# Patient Record
Sex: Female | Born: 1985 | Race: Black or African American | Hispanic: No | Smoking: Never smoker
Health system: Southern US, Community
[De-identification: ages and names within clinical notes are randomized; demographics above are authoritative.]

## PROBLEM LIST (undated history)

## (undated) DIAGNOSIS — I10 Essential (primary) hypertension: Secondary | ICD-10-CM

## (undated) DIAGNOSIS — N809 Endometriosis, unspecified: Secondary | ICD-10-CM

## (undated) DIAGNOSIS — R7303 Prediabetes: Secondary | ICD-10-CM

## (undated) DIAGNOSIS — N83209 Unspecified ovarian cyst, unspecified side: Secondary | ICD-10-CM

## (undated) HISTORY — PX: WISDOM TOOTH EXTRACTION: SHX21

## (undated) HISTORY — DX: Unspecified ovarian cyst, unspecified side: N83.209

---

## 2013-01-11 DIAGNOSIS — E049 Nontoxic goiter, unspecified: Secondary | ICD-10-CM | POA: Insufficient documentation

## 2017-06-06 ENCOUNTER — Ambulatory Visit (INDEPENDENT_AMBULATORY_CARE_PROVIDER_SITE_OTHER): Payer: BLUE CROSS/BLUE SHIELD | Admitting: Podiatry

## 2017-06-06 ENCOUNTER — Other Ambulatory Visit: Payer: Self-pay | Admitting: Podiatry

## 2017-06-06 ENCOUNTER — Encounter: Payer: Self-pay | Admitting: Podiatry

## 2017-06-06 ENCOUNTER — Ambulatory Visit (INDEPENDENT_AMBULATORY_CARE_PROVIDER_SITE_OTHER): Payer: BLUE CROSS/BLUE SHIELD

## 2017-06-06 VITALS — BP 119/73 | HR 69

## 2017-06-06 DIAGNOSIS — S93409A Sprain of unspecified ligament of unspecified ankle, initial encounter: Secondary | ICD-10-CM

## 2017-06-06 DIAGNOSIS — M25579 Pain in unspecified ankle and joints of unspecified foot: Secondary | ICD-10-CM

## 2017-06-06 MED ORDER — MELOXICAM 15 MG PO TABS: 15.0000 mg | ORAL_TABLET | Freq: Every day | ORAL | 0 refills | Status: DC

## 2017-06-06 NOTE — Progress Notes (Signed)
  Subjective:  Patient ID: April Holland, female    DOB: 1986/05/16,  MRN: [id number] HPI Chief Complaint  Patient presents with  . Ankle Pain    B/L     31 y.o. female presents with the above complaint. Reports that she has had chronic issues with her ankles for 10 years. Reports she is very active with Zumba, HIIT, and other activity which impacts her feet and ankles. Reports pain along her ankles and her arches. States that 20 years ago someone fell on her Rt ankle during a basketball game. Reports no recent trauma.   No past medical history on file. No past surgical history on file.  Current Outpatient Prescriptions:  .  meloxicam (MOBIC) 15 MG tablet, Take 1 tablet (15 mg total) by mouth daily., Disp: 30 tablet, Rfl: 0  Allergies  Allergen Reactions  . Sulfur Rash   Review of Systems Objective:   Vitals:   06/06/17 1000  BP: 119/73  Pulse: 69   General AA&O x3. Normal mood and affect.  Vascular Dorsalis pedis and posterior tibial pulses 2/4 bilat. Brisk capillary refill to all digits. Pedal hair present.  Neurologic Epicritic sensation grossly intact.  Dermatologic No open lesions. Interspaces clear of maceration. Nails well groomed and normal in appearance.  Orthopedic: MMT 5/5 in dorsiflexion, plantarflexion, inversion, and eversion. Normal joint ROM without pain or crepitus. Pain on palpation of the deltoid ligaments bilat. Pain with talar tilt but no excessive laxity noted. No pain at ATFL or CFL bilat.  No pain along the distal posterior tibial tendon.  Heels rectus in stance. Able to perform double/single heel rise.   Radiographs: Taken and reviewed. No acute fractures or dislocations. Osteophytes noted at the medial malleolus bilat; valgus ankle tilt bilat. Assessment & Plan:  Patient was evaluated and treated and all questions answered.  Chronic Deltoid Pain, Insufficiency with Osteophytes -XR reviewed as above. -Pain appears confined to the deltoids, no  intra-articular pathology. -Will make appointment for orthotic fabrication. Plan for aggressive heel posting to relieve stress on the deltoid ligaments. Will f/u with patient same day that she receives the devices. -eRx Meloxicam for pain and inflammation to be taken on days where she anticipates being active.  Follow up after orthotic fabrication.

## 2017-06-09 ENCOUNTER — Ambulatory Visit (INDEPENDENT_AMBULATORY_CARE_PROVIDER_SITE_OTHER): Payer: BLUE CROSS/BLUE SHIELD | Admitting: Orthotics

## 2017-06-09 DIAGNOSIS — M25579 Pain in unspecified ankle and joints of unspecified foot: Secondary | ICD-10-CM

## 2017-06-09 NOTE — Progress Notes (Signed)
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  Patient has hx of ankle foot pain, today she presents with pain on medial aspect of ankle posterior and superior to medial malleous.  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  EVERFEET

## 2017-07-07 ENCOUNTER — Ambulatory Visit (INDEPENDENT_AMBULATORY_CARE_PROVIDER_SITE_OTHER): Payer: BLUE CROSS/BLUE SHIELD | Admitting: Orthotics

## 2017-07-07 DIAGNOSIS — M25579 Pain in unspecified ankle and joints of unspecified foot: Secondary | ICD-10-CM

## 2017-07-07 NOTE — Progress Notes (Signed)
Patient came in today to pick up custom made foot orthotics.  The goals were accomplished and the patient reported no dissatisfaction with said orthotics.  Patient was advised of breakin period and how to report any issues. 

## 2017-10-28 ENCOUNTER — Other Ambulatory Visit: Payer: Self-pay | Admitting: Nurse Practitioner

## 2017-10-28 DIAGNOSIS — N6310 Unspecified lump in the right breast, unspecified quadrant: Secondary | ICD-10-CM

## 2017-10-31 ENCOUNTER — Ambulatory Visit
Admission: RE | Admit: 2017-10-31 | Discharge: 2017-10-31 | Disposition: A | Discharge status: Home / Self Care | Payer: BLUE CROSS/BLUE SHIELD | Source: Ambulatory Visit | Attending: Nurse Practitioner | Admitting: Nurse Practitioner

## 2017-10-31 DIAGNOSIS — N6310 Unspecified lump in the right breast, unspecified quadrant: Secondary | ICD-10-CM

## 2020-07-26 ENCOUNTER — Other Ambulatory Visit: Payer: Self-pay | Admitting: Obstetrics and Gynecology

## 2020-07-26 DIAGNOSIS — N979 Female infertility, unspecified: Secondary | ICD-10-CM

## 2020-08-02 ENCOUNTER — Encounter (HOSPITAL_BASED_OUTPATIENT_CLINIC_OR_DEPARTMENT_OTHER): Payer: Self-pay | Admitting: Obstetrics and Gynecology

## 2020-08-02 ENCOUNTER — Other Ambulatory Visit: Payer: Self-pay

## 2020-08-04 ENCOUNTER — Encounter (HOSPITAL_BASED_OUTPATIENT_CLINIC_OR_DEPARTMENT_OTHER)
Admission: RE | Admit: 2020-08-04 | Discharge: 2020-08-04 | Disposition: A | Discharge status: Home / Self Care | Payer: No Typology Code available for payment source | Source: Ambulatory Visit | Attending: Obstetrics and Gynecology | Admitting: Obstetrics and Gynecology

## 2020-08-04 DIAGNOSIS — Z01810 Encounter for preprocedural cardiovascular examination: Secondary | ICD-10-CM | POA: Insufficient documentation

## 2020-08-04 DIAGNOSIS — I10 Essential (primary) hypertension: Secondary | ICD-10-CM | POA: Diagnosis not present

## 2020-08-08 ENCOUNTER — Other Ambulatory Visit (HOSPITAL_COMMUNITY)
Admission: RE | Admit: 2020-08-08 | Discharge: 2020-08-08 | Disposition: A | Discharge status: Home / Self Care | Payer: No Typology Code available for payment source | Source: Ambulatory Visit | Attending: Obstetrics and Gynecology | Admitting: Obstetrics and Gynecology

## 2020-08-08 ENCOUNTER — Other Ambulatory Visit: Payer: Self-pay | Admitting: Obstetrics and Gynecology

## 2020-08-08 DIAGNOSIS — Z20822 Contact with and (suspected) exposure to covid-19: Secondary | ICD-10-CM | POA: Diagnosis not present

## 2020-08-08 DIAGNOSIS — Z01812 Encounter for preprocedural laboratory examination: Secondary | ICD-10-CM | POA: Insufficient documentation

## 2020-08-08 LAB — SARS CORONAVIRUS 2 (TAT 6-24 HRS): SARS Coronavirus 2: NEGATIVE

## 2020-08-08 NOTE — H&P (Signed)
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  Does patient report new onset of COVID symptoms?  No.  Has patient or close contact tested positive for COVID-19?  No , not in the past 2 weeks.         General          34 yo presents for surgical consult for polyp removal.            Pt last seen by NP Ermalinda Memos on Sept 2, 2021 c/o longer and heavier menses than normal. LMP was May 30, 2020 and she started bleeding again on Sept 2nd. She is trying to conceive. HGB July 21 was 11. EMB performed Apr 21 was benign. SHG performed Sept 2, 2021 revealed polyp-like mass.            U/S performed Sept 2, 2021 revealed uterus measuring 10.7 x 6.0 x 6.4 cm. Endometrium thickened at 9.4 mm. SHG performed at that time which revealed hyperechoic mass on anterior wall w/ largest diameter measuring 2.7 cm.            Today, pt reports she is interested in future fertility. She is having regular periods and endorses intermenstrual bleeding and heavy periods. She uses ultra tampons on her heaviest day which she changes every 3 hrs.            She has been trying to conceive for 1.5 yrs. Her husband had an abnormal semen analysis which showed decreased morphology. He was recommended a vitamin regimen. Pt's AMH in Dec 2020 was normal.      Current Medication:      Taking   Multivitamin - Liquid as directed Orally.      amLODIPine Besylate 2.5 MG Tablet TAKE 1 TABLET BY MOUTH EVERY DAY.      Boric Acid . capsule 600 mg intravaginally once a week, Notes: prn.      Medication List reviewed and reconciled with the patient.      Medical History:   Recurring Bartholin Cyst (Starting age 13)      Heart murmur (unspecified), in childhood       Allergies/Intolerance:       sulfa: Allergy - as baby    Gyn History:   Sexual activity currently sexually active.   Periods : irregular-getting back normal.   LMP 06/30/2020.   Birth control none.   Last pap smear date 10/2018.   Last mammogram date 10/31/2017-benign.   Denies Abnormal pap smear.   STD TRICH, HSV II.   Menarche 9.   GYN procedures 06/26/17 - EMB.   Trying to get pregnant yes.        OB History:   Never been pregnant  per patient.        Surgical History:   No Surgical History documented.       Hospitalization:   No Hospitalization History.       Family History:   Father: alive, HTN    Mother: alive, thyroid hypo, chrohns, heart problems    Paternal Grand Father: deceased    Paternal Grand Mother: alive, stroke, HTN, high cholesterol, another stroke    Maternal Grand Father: deceased, DM, CHF, HTN    Maternal [place]  Mother: alive, stroke, breast cancer survivor, HTN    Brother 1: alive    1 brother(s) .      Social History:       General         Tobacco use cigarettes:  Never smoked, Tobacco history last updated  07/20/2020.           Alcohol: yes, social.           Caffeine: yes.           no Recreational drug use.           Marital Status: married, Married.           Children: none.           OCCUPATION: employed, Designer, fashion/clothing.      ROS:       CONSTITUTIONAL         Chills  No.  Fatigue  No.  Fever  No.  Night sweats  No.  Recent travel outside [place]  No.  Sweats  No.  Weight change  No.         OPHTHALMOLOGY         Blurring of vision  no.  Change in vision  no.  Double vision  no.         ENT         Dizziness  no.  Nose bleeds  no.  Sore throat  no.  Teeth pain  no.         ALLERGY         Hives  no.         CARDIOLOGY         Chest pain  no.  High blood pressure  YES.  Irregular heart beat  no.  Leg edema  no.  Palpitations  no.         RESPIRATORY         Shortness of breath  no.  Cough  no.  Wheezing  no.         UROLOGY         Pain with  urination  no.  Urinary urgency  no.  Urinary frequency  no.  Urinary incontinence  no.  Difficulty urinating  No.  Blood in urine  No.         GASTROENTEROLOGY         Abdominal pain  no.  Appetite change  no.  Bloating/belching  no.  Blood in stool or on toilet paper  no.  Change in bowel movements  no.  Constipation  no.  Diarrhea  no.  Difficulty swallowing  no.  Nausea  no.         FEMALE REPRODUCTIVE         Vulvar pain  no.  Vulvar rash  no.  Abnormal vaginal bleeding  YES.  Breast pain  no.  Nipple discharge  no.  Pain with intercourse  no.  Pelvic pain  no.  Unusual vaginal discharge  no.  Vaginal itching  no.         MUSCULOSKELETAL         Muscle aches  no.         NEUROLOGY         Headache  no.  Tingling/numbness  no.  Weakness  no.         PSYCHOLOGY         Depression  no.  Anxiety  no.  Nervousness  no.  Sleep disturbances  no.  Suicidal ideation  no .         ENDOCRINOLOGY         Excessive thirst  no.  Excessive urination  no.  Hair loss  no.  Heat or cold intolerance  no.         HEMATOLOGY/LYMPH         Abnormal bleeding  no.  Easy bruising  no.  Swollen glands  no.         DERMATOLOGY         New/changing skin lesion  no.  Rash  no.  Sores  no.       Objective:    Vitals:        Wt 232.8, Wt change -6.6 lb, Ht 67.5, BMI 35.92, Pulse sitting 76, BP sitting 132/88.     Past Results:    Examination:          General Examination         CONSTITUTIONAL: alert, oriented, NAD.          SKIN:  moist, warm.          EYES:  Conjunctiva clear.          LUNGS: good I:E efffort noted.... , , clear to auscultation bilaterally.          HEART:  regular rate and rhythm.          ABDOMEN: no guarding.          FEMALE GENITOURINARY:  normal external genitalia, labia - unremarkable, vagina - pink moist mucosa, no lesions or abnormal discharge, cervix - no discharge or lesions or CMT, adnexa - no masses or tenderness, uterus - nontender and normal size on palpation.           EXTREMITIES: normal range of motion.          NEUROLOGIC EXAM: alert and oriented x 3.          PSYCH:  affect normal.      Physical Examination:         Pt aware of scribe services today.    Assessment:     Assessment:    Endometrial polyp - N84.0 (Primary)      Abnormal uterine bleeding (AUB) - N93.9      Infertility, female - N97.9        Plan:    Treatment:      Endometrial polyp          Notes: U/S performed Sept 2, 2021 revealed uterus measuring 10.7 x 6.0 x 6.4 cm. Endometrium thickened at 9.4 mm. SHG performed at that time which revealed hyperechoic mass on anterior wall w/ largest diameter measuring 2.7 cm. Discussed that polyp may be causing irregular bleeding. Recommended management of hysteroscopy/D&C w/ polypectomy. Pt advised she will be able to return home the same day. Discussed w/ pt risks of hysteroscopy including but not limited to infection, bleeding, possible perforation of uterus, with the need for further surgery. Discussed post-surgery avoidance of driving for 24 hrs and avoidance of intercourse for 2 weeks after procedure. Will check insurance coverage for hysteroscopy/D&C and polypectomy w/ Myosure.          Referral To:               Reason:Please check insurance coverage for hysteroscopy/D & C and polypectomy w/ Myosure       Abnormal uterine bleeding (AUB)  Notes: This has been improving on its own. Recommended Lysteda only if periods become heavier. Pt advised she will have to take 2 pills TID for 5 days at onset of bleeding. Will schedule hysteroscopy/D&C and polypectomy w/ MyoSure.          Referral To:               Reason:Please check insurance coverage for hysteroscopy/D & C and polypectomy w/ Myosure       Infertility, female          Notes: This may be female factor as husband had decreased morphology on recent semen analysis. Pt advised to contact office at onset of next period to schedule HSG test on Days 7-10 to evaluate tubal  patency. [facility].

## 2020-08-08 NOTE — H&P (Deleted)
  The note originally documented on this encounter has been moved the the encounter in which it belongs.  

## 2020-08-09 ENCOUNTER — Ambulatory Visit (HOSPITAL_BASED_OUTPATIENT_CLINIC_OR_DEPARTMENT_OTHER): Payer: No Typology Code available for payment source | Admitting: Certified Registered Nurse Anesthetist

## 2020-08-09 ENCOUNTER — Encounter (HOSPITAL_BASED_OUTPATIENT_CLINIC_OR_DEPARTMENT_OTHER): Payer: Self-pay | Admitting: Obstetrics and Gynecology

## 2020-08-09 ENCOUNTER — Ambulatory Visit (HOSPITAL_BASED_OUTPATIENT_CLINIC_OR_DEPARTMENT_OTHER)
Admission: RE | Admit: 2020-08-09 | Discharge: 2020-08-09 | Disposition: A | Discharge status: Home / Self Care | Payer: No Typology Code available for payment source | Source: Ambulatory Visit | Attending: Obstetrics and Gynecology | Admitting: Obstetrics and Gynecology

## 2020-08-09 ENCOUNTER — Encounter (HOSPITAL_BASED_OUTPATIENT_CLINIC_OR_DEPARTMENT_OTHER)
Admission: RE | Disposition: A | Discharge status: Home / Self Care | Payer: Self-pay | Source: Ambulatory Visit | Attending: Obstetrics and Gynecology

## 2020-08-09 ENCOUNTER — Other Ambulatory Visit: Payer: Self-pay

## 2020-08-09 DIAGNOSIS — Z803 Family history of malignant neoplasm of breast: Secondary | ICD-10-CM | POA: Insufficient documentation

## 2020-08-09 DIAGNOSIS — N920 Excessive and frequent menstruation with regular cycle: Secondary | ICD-10-CM | POA: Insufficient documentation

## 2020-08-09 DIAGNOSIS — N840 Polyp of corpus uteri: Secondary | ICD-10-CM | POA: Diagnosis not present

## 2020-08-09 DIAGNOSIS — Z6835 Body mass index (BMI) 35.0-35.9, adult: Secondary | ICD-10-CM | POA: Insufficient documentation

## 2020-08-09 DIAGNOSIS — Z823 Family history of stroke: Secondary | ICD-10-CM | POA: Diagnosis not present

## 2020-08-09 DIAGNOSIS — N979 Female infertility, unspecified: Secondary | ICD-10-CM | POA: Diagnosis not present

## 2020-08-09 DIAGNOSIS — Z8349 Family history of other endocrine, nutritional and metabolic diseases: Secondary | ICD-10-CM | POA: Diagnosis not present

## 2020-08-09 DIAGNOSIS — I10 Essential (primary) hypertension: Secondary | ICD-10-CM | POA: Insufficient documentation

## 2020-08-09 DIAGNOSIS — Z79899 Other long term (current) drug therapy: Secondary | ICD-10-CM | POA: Diagnosis not present

## 2020-08-09 DIAGNOSIS — Z882 Allergy status to sulfonamides status: Secondary | ICD-10-CM | POA: Insufficient documentation

## 2020-08-09 DIAGNOSIS — Z833 Family history of diabetes mellitus: Secondary | ICD-10-CM | POA: Diagnosis not present

## 2020-08-09 DIAGNOSIS — Z8379 Family history of other diseases of the digestive system: Secondary | ICD-10-CM | POA: Insufficient documentation

## 2020-08-09 DIAGNOSIS — Z8249 Family history of ischemic heart disease and other diseases of the circulatory system: Secondary | ICD-10-CM | POA: Diagnosis not present

## 2020-08-09 DIAGNOSIS — N939 Abnormal uterine and vaginal bleeding, unspecified: Secondary | ICD-10-CM | POA: Diagnosis present

## 2020-08-09 DIAGNOSIS — E669 Obesity, unspecified: Secondary | ICD-10-CM | POA: Diagnosis not present

## 2020-08-09 HISTORY — PX: DILATATION & CURETTAGE/HYSTEROSCOPY WITH MYOSURE: SHX6511

## 2020-08-09 HISTORY — DX: Essential (primary) hypertension: I10

## 2020-08-09 LAB — POCT PREGNANCY, URINE: Preg Test, Ur: NEGATIVE

## 2020-08-09 SURGERY — DILATATION & CURETTAGE/HYSTEROSCOPY WITH MYOSURE
Anesthesia: General | Site: Vagina

## 2020-08-09 MED ORDER — DEXAMETHASONE SODIUM PHOSPHATE 4 MG/ML IJ SOLN
INTRAMUSCULAR | Status: DC | PRN
  Administered 2020-08-09: 5 mg via INTRAVENOUS

## 2020-08-09 MED ORDER — LACTATED RINGERS IV SOLN: INTRAVENOUS | Status: DC

## 2020-08-09 MED ORDER — PROPOFOL 10 MG/ML IV BOLUS
INTRAVENOUS | Status: DC | PRN
  Administered 2020-08-09: 180 mg via INTRAVENOUS

## 2020-08-09 MED ORDER — FENTANYL CITRATE (PF) 100 MCG/2ML IJ SOLN: 25.0000 ug | INTRAMUSCULAR | Status: DC | PRN

## 2020-08-09 MED ORDER — POVIDONE-IODINE 10 % EX SWAB
2.0000 "application " | Freq: Once | CUTANEOUS | Status: AC
  Administered 2020-08-09: 2 via TOPICAL

## 2020-08-09 MED ORDER — SILVER NITRATE-POT NITRATE 75-25 % EX MISC
CUTANEOUS | Status: DC | PRN
  Administered 2020-08-09: 2

## 2020-08-09 MED ORDER — MIDAZOLAM HCL 2 MG/2ML IJ SOLN
INTRAMUSCULAR | Status: AC
  Filled 2020-08-09: qty 2

## 2020-08-09 MED ORDER — CELECOXIB 200 MG PO CAPS
ORAL_CAPSULE | ORAL | Status: AC
  Filled 2020-08-09: qty 2

## 2020-08-09 MED ORDER — DEXAMETHASONE SODIUM PHOSPHATE 10 MG/ML IJ SOLN
INTRAMUSCULAR | Status: AC
  Filled 2020-08-09: qty 1

## 2020-08-09 MED ORDER — LIDOCAINE HCL (CARDIAC) PF 100 MG/5ML IV SOSY
PREFILLED_SYRINGE | INTRAVENOUS | Status: DC | PRN
  Administered 2020-08-09: 100 mg via INTRAVENOUS

## 2020-08-09 MED ORDER — ACETAMINOPHEN 500 MG PO TABS
ORAL_TABLET | ORAL | Status: AC
  Filled 2020-08-09: qty 2

## 2020-08-09 MED ORDER — BUPIVACAINE HCL (PF) 0.25 % IJ SOLN
INTRAMUSCULAR | Status: AC
  Filled 2020-08-09: qty 30

## 2020-08-09 MED ORDER — SODIUM CHLORIDE 0.9 % IR SOLN
Status: DC | PRN
  Administered 2020-08-09: 3000 mL

## 2020-08-09 MED ORDER — BUPIVACAINE HCL (PF) 0.25 % IJ SOLN
INTRAMUSCULAR | Status: DC | PRN
  Administered 2020-08-09: 20 mL

## 2020-08-09 MED ORDER — PROPOFOL 10 MG/ML IV BOLUS
INTRAVENOUS | Status: AC
  Filled 2020-08-09: qty 20

## 2020-08-09 MED ORDER — SCOPOLAMINE 1 MG/3DAYS TD PT72
MEDICATED_PATCH | TRANSDERMAL | Status: AC
  Filled 2020-08-09: qty 1

## 2020-08-09 MED ORDER — ONDANSETRON HCL 4 MG/2ML IJ SOLN
INTRAMUSCULAR | Status: DC | PRN
  Administered 2020-08-09: 4 mg via INTRAVENOUS

## 2020-08-09 MED ORDER — IBUPROFEN 800 MG PO TABS: 800.0000 mg | ORAL_TABLET | Freq: Three times a day (TID) | ORAL | 0 refills | Status: DC | PRN

## 2020-08-09 MED ORDER — PROMETHAZINE HCL 25 MG/ML IJ SOLN: 6.2500 mg | INTRAMUSCULAR | Status: DC | PRN

## 2020-08-09 MED ORDER — MIDAZOLAM HCL 5 MG/5ML IJ SOLN
INTRAMUSCULAR | Status: DC | PRN
  Administered 2020-08-09: 2 mg via INTRAVENOUS

## 2020-08-09 MED ORDER — ACETAMINOPHEN 500 MG PO TABS
1000.0000 mg | ORAL_TABLET | ORAL | Status: AC
  Administered 2020-08-09: 1000 mg via ORAL

## 2020-08-09 MED ORDER — FENTANYL CITRATE (PF) 100 MCG/2ML IJ SOLN
INTRAMUSCULAR | Status: DC | PRN
Start: 2020-08-09 — End: 2020-08-09
  Administered 2020-08-09 (×2): 25 ug via INTRAVENOUS
  Administered 2020-08-09: 50 ug via INTRAVENOUS

## 2020-08-09 MED ORDER — LIDOCAINE 2% (20 MG/ML) 5 ML SYRINGE
INTRAMUSCULAR | Status: AC
  Filled 2020-08-09: qty 5

## 2020-08-09 MED ORDER — SCOPOLAMINE 1 MG/3DAYS TD PT72
1.0000 | MEDICATED_PATCH | Freq: Once | TRANSDERMAL | Status: DC
  Administered 2020-08-09: 1.5 mg via TRANSDERMAL

## 2020-08-09 MED ORDER — ONDANSETRON HCL 4 MG/2ML IJ SOLN
INTRAMUSCULAR | Status: AC
  Filled 2020-08-09: qty 2

## 2020-08-09 MED ORDER — FENTANYL CITRATE (PF) 100 MCG/2ML IJ SOLN
INTRAMUSCULAR | Status: AC
  Filled 2020-08-09: qty 2

## 2020-08-09 MED ORDER — CELECOXIB 200 MG PO CAPS
400.0000 mg | ORAL_CAPSULE | ORAL | Status: AC
  Administered 2020-08-09: 400 mg via ORAL

## 2020-08-09 SURGICAL SUPPLY — 17 items
CATH ROBINSON RED A/P 16FR (CATHETERS) ×3 IMPLANT
DEVICE MYOSURE LITE (MISCELLANEOUS) ×3 IMPLANT
DEVICE MYOSURE REACH (MISCELLANEOUS) IMPLANT
GAUZE 4X4 16PLY RFD (DISPOSABLE) ×3 IMPLANT
GLOVE BIOGEL M 6.5 STRL (GLOVE) ×3 IMPLANT
GLOVE BIOGEL PI IND STRL 6.5 (GLOVE) ×1 IMPLANT
GLOVE BIOGEL PI IND STRL 7.0 (GLOVE) ×1 IMPLANT
GLOVE BIOGEL PI INDICATOR 6.5 (GLOVE) ×2
GLOVE BIOGEL PI INDICATOR 7.0 (GLOVE) ×2
GOWN STRL REUS W/TWL LRG LVL3 (GOWN DISPOSABLE) ×6 IMPLANT
KIT PROCEDURE FLUENT (KITS) ×3 IMPLANT
PACK VAGINAL MINOR WOMEN LF (CUSTOM PROCEDURE TRAY) ×3 IMPLANT
PAD OB MATERNITY 4.3X12.25 (PERSONAL CARE ITEMS) ×3 IMPLANT
PAD PREP 24X48 CUFFED NSTRL (MISCELLANEOUS) ×3 IMPLANT
SEAL ROD LENS SCOPE MYOSURE (ABLATOR) ×3 IMPLANT
SLEEVE SCD COMPRESS KNEE MED (MISCELLANEOUS) ×3 IMPLANT
TOWEL GREEN STERILE FF (TOWEL DISPOSABLE) ×3 IMPLANT

## 2020-08-09 NOTE — Discharge Instructions (Signed)

## 2020-08-09 NOTE — Transfer of Care (Signed)
Immediate Anesthesia Transfer of Care Note  Patient: Cassara Nida  Procedure(s) Performed: DILATATION & CURETTAGE/HYSTEROSCOPY WITH MYOSURE (N/A Vagina )  Patient Location: PACU  Anesthesia Type:General  Level of Consciousness: awake, alert  and oriented  Airway & Oxygen Therapy: Patient Spontanous Breathing and Patient connected to nasal cannula oxygen  Post-op Assessment: Report given to RN and Post -op Vital signs reviewed and stable  Post vital signs: Reviewed and stable  Last Vitals:  Vitals Value Taken Time  BP    Temp    Pulse 82 08/09/20 1135  Resp 17 08/09/20 1135  SpO2 100 % 08/09/20 1135  Vitals shown include unvalidated device data.  Last Pain:  Vitals:   08/09/20 1003  TempSrc: Oral  PainSc: 0-No pain      Patients Stated Pain Goal: 4 (08/09/20 1003)  Complications: No complications documented.

## 2020-08-09 NOTE — Op Note (Signed)
08/09/2020  11:26 AM  PATIENT:  April Holland  34 y.o. female  PRE-OPERATIVE DIAGNOSIS:  N84.0 endometrial polyp N93.9 abnormal uterine bleeding  POST-OPERATIVE DIAGNOSIS:  endometrial polyp abnormal uterine bleeding  PROCEDURE:  Procedure(s): DILATATION & CURETTAGE/HYSTEROSCOPY WITH MYOSURE (N/A)  SURGEON:  Surgeon(s) and Role:    Gerald Leitz, MD - Primary  PHYSICIAN ASSISTANT:   ASSISTANTS: none   ANESTHESIA:   general  EBL:  20 mL   BLOOD ADMINISTERED:none  DRAINS: none   LOCAL MEDICATIONS USED:  MARCAINE     SPECIMEN:  Source of Specimen:  endometrial polyp  DISPOSITION OF SPECIMEN:  PATHOLOGY  COUNTS:  YES  TOURNIQUET:  * No tourniquets in log *  DICTATION: .Dragon Dictation  PLAN OF CARE: Discharge to home after PACU  PATIENT DISPOSITION:  PACU - hemodynamically stable.   Delay start of Pharmacological VTE agent (>24hrs) due to surgical blood loss or risk of bleeding: not applicable  Findings:  Normal appearing external genitalia, normal appearing cervix. 2 cm endometrial polyp.   Saline deficit less than 100 mL   Procedure: Patient was taken to the operating room where she was placed under general anesthesia. She was placed in the dorsal lithotomy position. She was prepped and draped in the usual sterile fashion. A speculum was placed into the vaginal vault. The anterior lip of the cervix was grasped with a single-tooth tenaculum. Quarter percent Marcaine was injected at the 4 and 8:00 positions of the cervix. The cervix was then sounded to 9 cm. The cervix was dilated to approximately 6 mm. Mysosure operative  hysteroscope was inserted. The findings noted above. Myosure lite   blade was introduced through the hysteroscope. The endometrial mass was removed in less than  5 minute.  There was no evidence of perforation. Hysteroscope was then removed.  The single-tooth tenaculum was removed from the anterior lip of the cervix. Patient was noted to have  bleeding from the tenaculum site. Silver nitrate was applied and excellent hemostasis was noted. The speculum was removed from the patient's vagina. She was awakened from anesthesia taken care  To the recovery  room awake and in stable condition. Sponge lap and needle counts were correct x2.

## 2020-08-09 NOTE — Anesthesia Preprocedure Evaluation (Signed)
Anesthesia Evaluation  Patient identified by MRN, date of birth, ID band Patient awake    Reviewed: Allergy & Precautions, NPO status , Patient's Chart, lab work & pertinent test results  Airway Mallampati: II  TM Distance: >3 FB Neck ROM: Full    Dental  (+) Teeth Intact   Pulmonary neg pulmonary ROS,    Pulmonary exam normal breath sounds clear to auscultation       Cardiovascular hypertension, Pt. on medications Normal cardiovascular exam Rhythm:Regular Rate:Normal     Neuro/Psych negative neurological ROS  negative psych ROS   GI/Hepatic negative GI ROS, Neg liver ROS,   Endo/Other  Obesity   Renal/GU negative Renal ROS     Musculoskeletal negative musculoskeletal ROS (+)   Abdominal   Peds  Hematology negative hematology ROS (+)   Anesthesia Other Findings Day of surgery medications reviewed with the patient.  Reproductive/Obstetrics AUB                             Anesthesia Physical Anesthesia Plan  ASA: II  Anesthesia Plan: General   Post-op Pain Management:    Induction: Intravenous  PONV Risk Score and Plan: 4 or greater and Midazolam, Scopolamine patch - Pre-op, Dexamethasone and Ondansetron  Airway Management Planned: LMA  Additional Equipment:   Intra-op Plan:   Post-operative Plan: Extubation in OR  Informed Consent: I have reviewed the patients History and Physical, chart, labs and discussed the procedure including the risks, benefits and alternatives for the proposed anesthesia with the patient or authorized representative who has indicated his/her understanding and acceptance.       Plan Discussed with: CRNA  Anesthesia Plan Comments:         Anesthesia Quick Evaluation

## 2020-08-09 NOTE — H&P (Signed)
Date of Initial H&P: 08/08/2020  History reviewed, patient examined, no change in status, stable for surgery.

## 2020-08-09 NOTE — Anesthesia Postprocedure Evaluation (Signed)
Anesthesia Post Note  Patient: April Holland  Procedure(s) Performed: DILATATION & CURETTAGE/HYSTEROSCOPY WITH MYOSURE (N/A Vagina )     Patient location during evaluation: PACU Anesthesia Type: General Level of consciousness: awake and alert, awake and oriented Pain management: pain level controlled Vital Signs Assessment: post-procedure vital signs reviewed and stable Respiratory status: spontaneous breathing, nonlabored ventilation and respiratory function stable Cardiovascular status: blood pressure returned to baseline and stable Postop Assessment: no apparent nausea or vomiting Anesthetic complications: no   No complications documented.  Last Vitals:  Vitals:   08/09/20 1153 08/09/20 1210  BP: 130/86 (!) 157/84  Pulse: 85 80  Resp: 17 17  Temp:  36.6 C  SpO2: 100% 100%    Last Pain:  Vitals:   08/09/20 1210  TempSrc:   PainSc: 0-No pain                 Cecile Hearing

## 2020-08-10 ENCOUNTER — Encounter (HOSPITAL_BASED_OUTPATIENT_CLINIC_OR_DEPARTMENT_OTHER): Payer: Self-pay | Admitting: Obstetrics and Gynecology

## 2020-08-10 LAB — SURGICAL PATHOLOGY

## 2020-10-21 HISTORY — PX: HYSTEROSCOPY: SHX211

## 2021-02-13 ENCOUNTER — Other Ambulatory Visit (HOSPITAL_COMMUNITY): Payer: Self-pay | Admitting: Internal Medicine

## 2021-02-13 DIAGNOSIS — R002 Palpitations: Secondary | ICD-10-CM

## 2021-02-16 ENCOUNTER — Telehealth (HOSPITAL_COMMUNITY): Payer: Self-pay | Admitting: *Deleted

## 2021-02-16 NOTE — Telephone Encounter (Signed)
Close encounter 

## 2021-02-20 ENCOUNTER — Ambulatory Visit (HOSPITAL_COMMUNITY)
Admission: RE | Admit: 2021-02-20 | Payer: No Typology Code available for payment source | Source: Ambulatory Visit | Attending: Internal Medicine | Admitting: Internal Medicine

## 2021-02-22 ENCOUNTER — Telehealth (HOSPITAL_COMMUNITY): Payer: Self-pay | Admitting: *Deleted

## 2021-02-22 NOTE — Telephone Encounter (Signed)
Close encounter 

## 2021-02-23 ENCOUNTER — Ambulatory Visit (HOSPITAL_COMMUNITY)
Admission: RE | Admit: 2021-02-23 | Discharge: 2021-02-23 | Disposition: A | Discharge status: Home / Self Care | Payer: No Typology Code available for payment source | Source: Ambulatory Visit | Attending: Cardiology | Admitting: Cardiology

## 2021-02-23 ENCOUNTER — Other Ambulatory Visit: Payer: Self-pay

## 2021-02-23 DIAGNOSIS — R002 Palpitations: Secondary | ICD-10-CM | POA: Diagnosis not present

## 2021-02-23 LAB — EXERCISE TOLERANCE TEST
Estimated workload: 10.1 METS
Exercise duration (min): 8 min
Exercise duration (sec): 17 s
MPHR: 183 {beats}/min
Peak HR: 181 {beats}/min
Percent HR: 98 %
Rest HR: 81 {beats}/min

## 2021-04-20 ENCOUNTER — Other Ambulatory Visit: Payer: Self-pay | Admitting: Gastroenterology

## 2021-04-20 DIAGNOSIS — R7989 Other specified abnormal findings of blood chemistry: Secondary | ICD-10-CM

## 2021-04-30 ENCOUNTER — Encounter: Payer: Self-pay | Admitting: Cardiovascular Disease

## 2021-05-21 ENCOUNTER — Other Ambulatory Visit: Payer: No Typology Code available for payment source

## 2021-06-05 ENCOUNTER — Ambulatory Visit (HOSPITAL_BASED_OUTPATIENT_CLINIC_OR_DEPARTMENT_OTHER): Payer: No Typology Code available for payment source | Admitting: Cardiovascular Disease

## 2021-07-09 ENCOUNTER — Ambulatory Visit (HOSPITAL_BASED_OUTPATIENT_CLINIC_OR_DEPARTMENT_OTHER): Payer: No Typology Code available for payment source | Admitting: Cardiovascular Disease

## 2021-08-20 ENCOUNTER — Ambulatory Visit (HOSPITAL_BASED_OUTPATIENT_CLINIC_OR_DEPARTMENT_OTHER): Payer: No Typology Code available for payment source | Admitting: Cardiovascular Disease

## 2021-09-17 ENCOUNTER — Encounter (HOSPITAL_BASED_OUTPATIENT_CLINIC_OR_DEPARTMENT_OTHER): Payer: Self-pay | Admitting: Cardiovascular Disease

## 2021-09-17 ENCOUNTER — Ambulatory Visit (HOSPITAL_BASED_OUTPATIENT_CLINIC_OR_DEPARTMENT_OTHER): Payer: No Typology Code available for payment source | Admitting: Cardiovascular Disease

## 2021-09-17 ENCOUNTER — Other Ambulatory Visit: Payer: Self-pay

## 2021-09-17 DIAGNOSIS — I10 Essential (primary) hypertension: Secondary | ICD-10-CM | POA: Diagnosis not present

## 2021-09-17 HISTORY — DX: Essential (primary) hypertension: I10

## 2021-09-17 NOTE — Progress Notes (Signed)
Advanced Hypertension Clinic Initial Assessment:    Date:  09/17/2021   ID:  April Holland, DOB 18-Apr-1986, MRN [id number]  PCP:  Gyne[facility]  Cardiologist:  None  Nephrologist:  Referring MD: Maxie Better, MD   CC: Hypertension  History of Present Illness:    April Holland is a 35 y.o. female with a hx of hypertension here to establish care in the Advanced Hypertension Clinic.  She saw Dr. Cherly Hensen 01/2021 and was contemplating IVF at that time.  At that visit her blood pressure was 144/92.  Her labetalol was increased and she was referred to advanced hypertension clinic for preconception planning.  She had an exercise stress test 02/2021 that was negative.  She achieved 10.1 METS on a Bruce protocol.  She was first diagnosed with HTN a couple years ago.  She started treatment about a year ago. Lately her BP has been as high as 170/110.  It averages around 150/90.  She was previously on labetalol 100 mg twice a day without improvement.  Prior to that she was on amlodipine 2.5 mg and this did not control her blood pressure either.  She hasn't been exercising much.  She works at [employer].  She does housework but no other exercise.  She wants to start a daily walk.  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  She has no chest pain or shortness of breath.  She has no LE edema, orthopnea or PND.  She has been fasting by eliminating gluten, dairy and sugar and was on fluconazonle.  She lost 10-15 lb.  She is working on transitioning to a high protein, moderate carbs.  Overall she is feeling well.  She sees a functional medicine doctor and notes that one of her thyroid antibodies was abnormal but TSH, T3, and T4 were normal.    Previous antihypertensives: Amlodipine Labetalol  Past Medical History:  Diagnosis Date   Essential hypertension 09/17/2021   Hypertension     Past Surgical History:  Procedure Laterality Date   DILATATION &  CURETTAGE/HYSTEROSCOPY WITH MYOSURE N/A 08/09/2020   Procedure: DILATATION & CURETTAGE/HYSTEROSCOPY WITH MYOSURE;  Surgeon: Gerald Leitz, MD;  Location: [facility];  Service: Gynecology;  Laterality: N/A;   WISDOM TOOTH EXTRACTION      Current Medications: Current Meds  Medication Sig   metFORMIN (GLUCOPHAGE) 500 MG tablet Take 500 mg by mouth 2 (two) times daily.   tranexamic acid (LYSTEDA) 650 MG TABS tablet Take 1,300 mg by mouth 3 (three) times daily.     Allergies:   Elemental sulfur   Social History   Socioeconomic History   Marital status: Married    Spouse name: Not on file   Number of children: Not on file   Years of education: Not on file   Highest education level: Not on file  Occupational History   Not on file  Tobacco Use   Smoking status: Never   Smokeless tobacco: Never  Substance and Sexual Activity   Alcohol use: Yes    Comment: social   Drug use: No   Sexual activity: Not on file  Other Topics Concern   Not on file  Social History Narrative   Not on file   Social Determinants of Health   Financial Resource Strain: Low Risk    Difficulty of Paying Living Expenses: Not very hard  Food Insecurity: No Food Insecurity   Worried About Running Out of Food in the Last Year:  Never true   Ran Out of Food in the Last Year: Never true  Transportation Needs: No Transportation Needs   Lack of Transportation (Medical): No   Lack of Transportation (Non-Medical): No  Physical Activity: Inactive   Days of Exercise per Week: 0 days   Minutes of Exercise per Session: 0 min  Stress: Not on file  Social Connections: Not on file     Family History: The patient's family history includes Breast cancer in her maternal grandfather; Diabetes in her maternal grandfather; Heart failure in her maternal grandfather; Stroke in her maternal grandmother and paternal grandmother; Thyroid disease in her mother.  ROS:   Please see the history of present illness.     All other systems reviewed and are negative.  EKGs/Labs/Other Studies Reviewed:    EKG:  EKG is ordered today.  The ekg ordered today demonstrates sinus rhythm.  94 bpm.  Recent Labs: No results found for requested labs within last 8760 hours.   Recent Lipid Panel No results found for: CHOL, TRIG, HDL, CHOLHDL, VLDL, LDLCALC, LDLDIRECT  Physical Exam:   VS:  BP 122/78   Pulse 94   Ht 5\' 8"  (1.727 m)   Wt 230 lb (104.3 kg)   BMI 34.97 kg/m  , BMI Body mass index is 34.97 kg/m. GENERAL:  Well appearing HEENT: Pupils equal round and reactive, fundi not visualized, oral mucosa unremarkable NECK:  No jugular venous distention, waveform within normal limits, carotid upstroke brisk and symmetric, no bruits, no thyromegaly LUNGS:  Clear to auscultation bilaterally HEART:  RRR.  PMI not displaced or sustained,S1 and S2 within normal limits, no S3, no S4, no clicks, no rubs, no murmurs ABD:  Flat, positive bowel sounds normal in frequency in pitch, no bruits, no rebound, no guarding, no midline pulsatile mass, no hepatomegaly, no splenomegaly EXT:  2 plus pulses throughout, no edema, no cyanosis no clubbing SKIN:  No rashes no nodules NEURO:  Cranial nerves II through XII grossly intact, motor grossly intact throughout PSYCH:  Cognitively intact, oriented to person place and time   ASSESSMENT/PLAN:    Essential hypertension Ms. Reigel has hypertension.  However her blood pressure seems to be doing much better now that she has been fasting and losing weight.  She is continuing to work on exercise and it was recommended that she increase to 650 minutes weekly.  She is going to keep tracking her blood pressure and be mindful as she reintroduces foods into her diet.  She will continue to limit sodium and try to aim for less than 2500 mg daily.  She is going to check her blood pressures and bring to follow-up in 2 to 3 months.  She is considering IVF as well as try to conceive naturally.  We  will keep this in mind should she need medication in the future.   Screening for Secondary Hypertension:  Causes 09/17/2021  Drugs/Herbals Screened     - Comments limiting salt.  1 coffee daily. iron supplement, occasional EtOH  Renovascular HTN N/A  Sleep Apnea Screened     - Comments no symptoms  Thyroid Disease Screened  Hyperaldosteronism N/A  Pheochromocytoma N/A  Cushing's Syndrome N/A  Hyperparathyroidism N/A  Coarctation of the Aorta Screened  Compliance Screened     - Comments ran out of BP medication    Relevant Labs/Studies:        Disposition:    FU with MD/PharmD in 2-3 months.    Medication Adjustments/Labs and Tests Ordered: Current  medicines are reviewed at length with the patient today.  Concerns regarding medicines are outlined above.  Orders Placed This Encounter  Procedures   EKG 12-Lead    No orders of the defined types were placed in this encounter.    Signed, Chilton Si, MD  09/17/2021 6:05 PM    [facility]

## 2021-09-17 NOTE — Assessment & Plan Note (Signed)
April Holland has hypertension.  However her blood pressure seems to be doing much better now that she has been fasting and losing weight.  She is continuing to work on exercise and it was recommended that she increase to 650 minutes weekly.  She is going to keep tracking her blood pressure and be mindful as she reintroduces foods into her diet.  She will continue to limit sodium and try to aim for less than 2500 mg daily.  She is going to check her blood pressures and bring to follow-up in 2 to 3 months.  She is considering IVF as well as try to conceive naturally.  We will keep this in mind should she need medication in the future.

## 2021-09-17 NOTE — Patient Instructions (Signed)
Medication Instructions:  Your physician recommends that you continue on your current medications as directed. Please refer to the Current Medication list given to you today.    Labwork: NONE    Testing/Procedures: NONE   Follow-Up: 12/05/2021 AT 9:00 AM    Special Instructions:  MONITOR YOUR BLOOD PRESSURE DAILY,  LOG IN THE BOOK PROVIDED. BRING THE BOOK AND YOUR BLOOD PRESSURE MACHINE TO YOUR FOLLOW UP   DASH Eating Plan DASH stands for "Dietary Approaches to Stop Hypertension." The DASH eating plan is a healthy eating plan that has been shown to reduce high blood pressure (hypertension). It may also reduce your risk for type 2 diabetes, heart disease, and stroke. The DASH eating plan may also help with weight loss. What are tips for following this plan?  General guidelines Avoid eating more than 2,300 mg (milligrams) of salt (sodium) a day. If you have hypertension, you may need to reduce your sodium intake to 1,500 mg a day. Limit alcohol intake to no more than 1 drink a day for nonpregnant women and 2 drinks a day for men. One drink equals 12 oz of beer, 5 oz of wine, or 1 oz of hard liquor. Work with your health care provider to maintain a healthy body weight or to lose weight. Ask what an ideal weight is for you. Get at least 30 minutes of exercise that causes your heart to beat faster (aerobic exercise) most days of the week. Activities may include walking, swimming, or biking. Work with your health care provider or diet and nutrition specialist (dietitian) to adjust your eating plan to your individual calorie needs. Reading food labels  Check food labels for the amount of sodium per serving. Choose foods with less than 5 percent of the Daily Value of sodium. Generally, foods with less than 300 mg of sodium per serving fit into this eating plan. To find whole grains, look for the word "whole" as the first word in the ingredient list. Shopping Buy products labeled as "low-sodium"  or "no salt added." Buy fresh foods. Avoid canned foods and premade or frozen meals. Cooking Avoid adding salt when cooking. Use salt-free seasonings or herbs instead of table salt or sea salt. Check with your health care provider or pharmacist before using salt substitutes. Do not fry foods. Cook foods using healthy methods such as baking, boiling, grilling, and broiling instead. Cook with heart-healthy oils, such as olive, canola, soybean, or sunflower oil. Meal planning Eat a balanced diet that includes: 5 or more servings of fruits and vegetables each day. At each meal, try to fill half of your plate with fruits and vegetables. Up to 6-8 servings of whole grains each day. Less than 6 oz of lean meat, poultry, or fish each day. A 3-oz serving of meat is about the same size as a deck of cards. One egg equals 1 oz. 2 servings of low-fat dairy each day. A serving of nuts, seeds, or beans 5 times each week. Heart-healthy fats. Healthy fats called Omega-3 fatty acids are found in foods such as flaxseeds and coldwater fish, like sardines, salmon, and mackerel. Limit how much you eat of the following: Canned or prepackaged foods. Food that is high in trans fat, such as fried foods. Food that is high in saturated fat, such as fatty meat. Sweets, desserts, sugary drinks, and other foods with added sugar. Full-fat dairy products. Do not salt foods before eating. Try to eat at least 2 vegetarian meals each week. Eat more home-cooked  food and less restaurant, buffet, and fast food. When eating at a restaurant, ask that your food be prepared with less salt or no salt, if possible. What foods are recommended? The items listed may not be a complete list. Talk with your dietitian about what dietary choices are best for you. Grains Whole-grain or whole-wheat bread. Whole-grain or whole-wheat pasta. Brown rice. Orpah Cobb. Bulgur. Whole-grain and low-sodium cereals. Pita bread. Low-fat, low-sodium  crackers. Whole-wheat flour tortillas. Vegetables Fresh or frozen vegetables (raw, steamed, roasted, or grilled). Low-sodium or reduced-sodium tomato and vegetable juice. Low-sodium or reduced-sodium tomato sauce and tomato paste. Low-sodium or reduced-sodium canned vegetables. Fruits All fresh, dried, or frozen fruit. Canned fruit in natural juice (without added sugar). Meat and other protein foods Skinless chicken or [place]. Ground chicken or [place]. Pork with fat trimmed off. Fish and seafood. Egg whites. Dried beans, peas, or lentils. Unsalted nuts, nut butters, and seeds. Unsalted canned beans. Lean cuts of beef with fat trimmed off. Low-sodium, lean deli meat. Dairy Low-fat (1%) or fat-free (skim) milk. Fat-free, low-fat, or reduced-fat cheeses. Nonfat, low-sodium ricotta or cottage cheese. Low-fat or nonfat yogurt. Low-fat, low-sodium cheese. Fats and oils Soft margarine without trans fats. Vegetable oil. Low-fat, reduced-fat, or light mayonnaise and salad dressings (reduced-sodium). Canola, safflower, olive, soybean, and sunflower oils. Avocado. Seasoning and other foods Herbs. Spices. Seasoning mixes without salt. Unsalted popcorn and pretzels. Fat-free sweets. What foods are not recommended? The items listed may not be a complete list. Talk with your dietitian about what dietary choices are best for you. Grains Baked goods made with fat, such as croissants, muffins, or some breads. Dry pasta or rice meal packs. Vegetables Creamed or fried vegetables. Vegetables in a cheese sauce. Regular canned vegetables (not low-sodium or reduced-sodium). Regular canned tomato sauce and paste (not low-sodium or reduced-sodium). Regular tomato and vegetable juice (not low-sodium or reduced-sodium). Rosita Fire. Olives. Fruits Canned fruit in a light or heavy syrup. Fried fruit. Fruit in cream or butter sauce. Meat and other protein foods Fatty cuts of meat. Ribs. Fried meat. Tomasa Blase. Sausage. Bologna and  other processed lunch meats. Salami. Fatback. Hotdogs. Bratwurst. Salted nuts and seeds. Canned beans with added salt. Canned or smoked fish. Whole eggs or egg yolks. Chicken or [place] with skin. Dairy Whole or 2% milk, cream, and half-and-half. Whole or full-fat cream cheese. Whole-fat or sweetened yogurt. Full-fat cheese. Nondairy creamers. Whipped toppings. Processed cheese and cheese spreads. Fats and oils Butter. Stick margarine. Lard. Shortening. Ghee. Bacon fat. Tropical oils, such as coconut, palm kernel, or palm oil. Seasoning and other foods Salted popcorn and pretzels. Onion salt, garlic salt, seasoned salt, table salt, and sea salt. Worcestershire sauce. Tartar sauce. Barbecue sauce. Teriyaki sauce. Soy sauce, including reduced-sodium. Steak sauce. Canned and packaged gravies. Fish sauce. Oyster sauce. Cocktail sauce. Horseradish that you find on the shelf. Ketchup. Mustard. Meat flavorings and tenderizers. Bouillon cubes. Hot sauce and Tabasco sauce. Premade or packaged marinades. Premade or packaged taco seasonings. Relishes. Regular salad dressings. Where to find more information: National Heart, Lung, and Blood Institute: PopSteam.is American Heart Association: www.heart.org Summary The DASH eating plan is a healthy eating plan that has been shown to reduce high blood pressure (hypertension). It may also reduce your risk for type 2 diabetes, heart disease, and stroke. With the DASH eating plan, you should limit salt (sodium) intake to 2,300 mg a day. If you have hypertension, you may need to reduce your sodium intake to 1,500 mg a day. When on the  DASH eating plan, aim to eat more fresh fruits and vegetables, whole grains, lean proteins, low-fat dairy, and heart-healthy fats. Work with your health care provider or diet and nutrition specialist (dietitian) to adjust your eating plan to your individual calorie needs. This information is not intended to replace advice given to you  by your health care provider. Make sure you discuss any questions you have with your health care provider. Document Released: 09/26/2011 Document Revised: 09/19/2017 Document Reviewed: 09/30/2016 Elsevier Patient Education  2020 [employer].

## 2021-10-08 ENCOUNTER — Ambulatory Visit (HOSPITAL_BASED_OUTPATIENT_CLINIC_OR_DEPARTMENT_OTHER): Payer: No Typology Code available for payment source | Admitting: Cardiovascular Disease

## 2021-11-12 ENCOUNTER — Other Ambulatory Visit: Payer: Self-pay | Admitting: Internal Medicine

## 2021-11-12 DIAGNOSIS — N6310 Unspecified lump in the right breast, unspecified quadrant: Secondary | ICD-10-CM

## 2021-12-04 ENCOUNTER — Other Ambulatory Visit: Payer: No Typology Code available for payment source

## 2021-12-05 ENCOUNTER — Ambulatory Visit (HOSPITAL_BASED_OUTPATIENT_CLINIC_OR_DEPARTMENT_OTHER): Payer: No Typology Code available for payment source | Admitting: Cardiovascular Disease

## 2021-12-05 ENCOUNTER — Other Ambulatory Visit: Payer: No Typology Code available for payment source

## 2021-12-12 ENCOUNTER — Other Ambulatory Visit: Payer: No Typology Code available for payment source

## 2021-12-18 ENCOUNTER — Ambulatory Visit
Admission: RE | Admit: 2021-12-18 | Discharge: 2021-12-18 | Disposition: A | Discharge status: Home / Self Care | Payer: No Typology Code available for payment source | Source: Ambulatory Visit | Attending: Internal Medicine | Admitting: Internal Medicine

## 2021-12-18 DIAGNOSIS — N6310 Unspecified lump in the right breast, unspecified quadrant: Secondary | ICD-10-CM

## 2022-01-01 ENCOUNTER — Ambulatory Visit: Payer: No Typology Code available for payment source | Admitting: Internal Medicine

## 2022-01-23 ENCOUNTER — Ambulatory Visit: Payer: No Typology Code available for payment source | Admitting: Internal Medicine

## 2022-01-23 NOTE — Progress Notes (Signed)
? ?Advanced Hypertension Clinic:  ? ?Date:  01/24/2022  ? ?ID:  April Holland, DOB 06/10/1986, MRN [id number] ? ?PCP:  Collene MaresMiller, Virginia E, PA  ?Cardiologist:  None  ?Nephrologist: ? ?Referring MD: Gynecology, Jiles ProwsEagle Obste*  ? ?CC: Hypertension ? ?History of Present Illness:   ? ?April Holland is a 36 y.o. female with a hx of hypertension here for follow-up. She was initially seen 08/2021 to establish care in the [facility]. She saw Dr. Cherly Hensenousins 01/2021 and was contemplating IVF at that time. At that visit her blood pressure was 144/92. Her labetalol was increased and she was referred to advanced hypertension clinic for preconception planning. She had an exercise stress test 02/2021 that was negative. She achieved 10.1 METS on a Bruce protocol.  She was first diagnosed with HTN a couple years ago.  She started treatment about a year ago. Lately her BP has been as high as 170/110.  It averages around 150/90.  She was previously on labetalol 100 mg twice a day without improvement.  Prior to that she was on amlodipine 2.5 mg and this did not control her blood pressure either. ? ?At her last visit, she was doing well and continuing to work on exercise and weight loss. Her blood pressure was better controlled. Today, she is doing well with no major complaints. Her blood pressure at home ranges in the lower 130s systolic. She has not been recording her home pressure recently because her machine broke. She is continuing to lose weight. She started taking collagen, multivitamin, and vitamin D supplements. She is trying to re-establish an exercise regimen. Currently, she walks a few days weekly. For diet, she is cutting back on fast food and coffee. She is drinking more water and sparkling water. She is trying to conceive. She denies any palpitations, chest pain, or shortness of breath, lightheadedness, headaches, syncope, orthopnea, PND, lower extremity edema or exertional symptoms. ? ?Previous  antihypertensives: ?Amlodipine ?Labetalol ? ?Past Medical History:  ?Diagnosis Date  ? Essential hypertension 09/17/2021  ? Hypertension   ? ? ?Past Surgical History:  ?Procedure Laterality Date  ? DILATATION & CURETTAGE/HYSTEROSCOPY WITH MYOSURE N/A 08/09/2020  ? Procedure: DILATATION & CURETTAGE/HYSTEROSCOPY WITH MYOSURE;  Surgeon: Gerald Leitzole, Tara, MD;  Location: [facility];  Service: Gynecology;  Laterality: N/A;  ? WISDOM TOOTH EXTRACTION    ? ? ?Current Medications: ?Current Meds  ?Medication Sig  ? cholecalciferol (VITAMIN D3) 25 MCG (1000 UNIT) tablet Take 1,000 Units by mouth daily.  ? ibuprofen (ADVIL) 800 MG tablet Take 1 tablet (800 mg total) by mouth every 8 (eight) hours as needed.  ? levothyroxine (SYNTHROID) 25 MCG tablet Take 1 tablet by mouth daily at 12 noon.  ? metFORMIN (GLUCOPHAGE) 500 MG tablet Take 500 mg by mouth 2 (two) times daily.  ? Multiple Vitamin (MULTIVITAMIN WITH MINERALS) TABS tablet Take 1 tablet by mouth daily.  ?  ? ?Allergies:   Elemental sulfur  ? ?Social History  ? ?Socioeconomic History  ? Marital status: Married  ?  Spouse name: Not on file  ? Number of children: Not on file  ? Years of education: Not on file  ? Highest education level: Not on file  ?Occupational History  ? Not on file  ?Tobacco Use  ? Smoking status: Never  ? Smokeless tobacco: Never  ?Substance and Sexual Activity  ? Alcohol use: Yes  ?  Comment: social  ? Drug use: No  ? Sexual activity: Not on file  ?  Other Topics Concern  ? Not on file  ?Social History Narrative  ? Not on file  ? ?Social Determinants of Health  ? ?Financial Resource Strain: Low Risk   ? Difficulty of Paying Living Expenses: Not very hard  ?Food Insecurity: No Food Insecurity  ? Worried About Programme researcher, broadcasting/film/video in the Last Year: Never true  ? Ran Out of Food in the Last Year: Never true  ?Transportation Needs: No Transportation Needs  ? Lack of Transportation (Medical): No  ? Lack of Transportation (Non-Medical): No  ?Physical  Activity: Inactive  ? Days of Exercise per Week: 0 days  ? Minutes of Exercise per Session: 0 min  ?Stress: Not on file  ?Social Connections: Not on file  ?  ? ?Family History: ?The patient's family history includes Breast cancer in her maternal grandfather; Diabetes in her maternal grandfather; Heart failure in her maternal grandfather; Stroke in her maternal grandmother and paternal grandmother; Thyroid disease in her mother. ? ?ROS:   ?Please see the history of present illness.    ?All other systems reviewed and are negative. ? ?EKGs/Labs/Other Studies Reviewed:   ? ?ETT 02/23/21 ?The patient exercised following the Bruce protocol.   ?The patient reported claudication during the stress test. The patient experienced no angina during the stress test.  ? ?The patient requested the test to be stopped.  ? ?Heart rate demonstrated a normal response to exercise. Blood pressure demonstrated a hypertensive response to exercise. Overall, the patient's exercise capacity was normal.  ? ?85% of maximum heart rate was achieved after 6 minutes. Recovery time: 7 minutes. The patient's response to exercise was adequate for diagnosis. Pt requested stop due to back and leg pain. ? ?EKG: EKG was not ordered today ?09/17/21: sinus rhythm.  94 bpm. ? ?Recent Labs: ?No results found for requested labs within last 8760 hours.  ? ?Recent Lipid Panel ?No results found for: CHOL, TRIG, HDL, CHOLHDL, VLDL, LDLCALC, LDLDIRECT ? ?Physical Exam:   ?VS:  BP 130/72 (BP Location: Right Arm, Patient Position: Sitting, Cuff Size: Large)   Pulse 89   Ht 5\' 8"  (1.727 m)   Wt 228 lb 11.2 oz (103.7 kg)   SpO2 99%   BMI 34.77 kg/m?  , BMI Body mass index is 34.77 kg/m?. ?GENERAL:  Well appearing ?HEENT: Pupils equal round and reactive, fundi not visualized, oral mucosa unremarkable ?NECK:  No jugular venous distention, waveform within normal limits, carotid upstroke brisk and symmetric, no bruits, no thyromegaly ?LUNGS:  Clear to auscultation  bilaterally ?HEART:  RRR.  PMI not displaced or sustained,S1 and S2 within normal limits, no S3, no S4, no clicks, no rubs, no murmurs ?ABD:  Flat, positive bowel sounds normal in frequency in pitch, no bruits, no rebound, no guarding, no midline pulsatile mass, no hepatomegaly, no splenomegaly ?EXT:  2 plus pulses throughout, no edema, no cyanosis no clubbing ?SKIN:  No rashes no nodules ?NEURO:  Cranial nerves II through XII grossly intact, motor grossly intact throughout ?PSYCH:  Cognitively intact, oriented to person place and time ? ?ASSESSMENT/PLAN:   ? ?Essential hypertension ?Blood pressure is better controlled.  She is working on diet and exercise.  She will increase the frequency of her exercise.  She will keep tracking her BP and make sure it remains <130/80.  She is considering pregnancy.  BP will need to be monitored closely if she becomes pregnant.  Continue to abstain from medication for now.  We discussed the fact that with age, she  will likely eventually need BP meds. ? ? ? ?Screening for Secondary Hypertension:  ? ?  09/17/2021  ?  3:06 PM  ?Causes  ?Drugs/Herbals Screened  ?   - Comments limiting salt.  1 coffee daily. iron supplement, occasional EtOH  ?Renovascular HTN N/A  ?Sleep Apnea Screened  ?   - Comments no symptoms  ?Thyroid Disease Screened  ?Hyperaldosteronism N/A  ?Pheochromocytoma N/A  ?Cushing's Syndrome N/A  ?Hyperparathyroidism N/A  ?Coarctation of the Aorta Screened  ?Compliance Screened  ?   - Comments ran out of BP medication  ?  ?Relevant Labs/Studies: ?   ?   ? ?Disposition: FU with Waylynn Benefiel C. Duke Salvia, MD, [facility] as needed ? ? ?Medication Adjustments/Labs and Tests Ordered: ?Current medicines are reviewed at length with the patient today.  Concerns regarding medicines are outlined above.  ?No orders of the defined types were placed in this encounter. ? ? ?No orders of the defined types were placed in this encounter. ? ?I,Mykaella Javier,acting as a scribe for Chilton Si,  MD.,have documented all relevant documentation on the behalf of Chilton Si, MD,as directed by  Chilton Si, MD while in the presence of Chilton Si, MD. ? ?I, Siren Porrata C. Duke Salvia, MD have reviewed all documentation

## 2022-01-24 ENCOUNTER — Encounter (HOSPITAL_BASED_OUTPATIENT_CLINIC_OR_DEPARTMENT_OTHER): Payer: Self-pay | Admitting: Cardiovascular Disease

## 2022-01-24 ENCOUNTER — Ambulatory Visit (HOSPITAL_BASED_OUTPATIENT_CLINIC_OR_DEPARTMENT_OTHER): Payer: No Typology Code available for payment source | Admitting: Cardiovascular Disease

## 2022-01-24 DIAGNOSIS — I10 Essential (primary) hypertension: Secondary | ICD-10-CM | POA: Diagnosis not present

## 2022-01-24 NOTE — Patient Instructions (Signed)
Medication Instructions:  ?Your physician recommends that you continue on your current medications as directed. Please refer to the Current Medication list given to you today.  ? ?Labwork: ?NONE ? ?Testing/Procedures: ?NONE ? ?Follow-Up: ?AS NEEDED  ? ?  ?

## 2022-01-24 NOTE — Assessment & Plan Note (Signed)
Blood pressure is better controlled.  She is working on diet and exercise.  She will increase the frequency of her exercise.  She will keep tracking her BP and make sure it remains <130/80.  She is considering pregnancy.  BP will need to be monitored closely if she becomes pregnant.  Continue to abstain from medication for now.  We discussed the fact that with age, she will likely eventually need BP meds. ?

## 2022-02-27 DIAGNOSIS — R7303 Prediabetes: Secondary | ICD-10-CM | POA: Insufficient documentation

## 2022-02-27 DIAGNOSIS — E559 Vitamin D deficiency, unspecified: Secondary | ICD-10-CM | POA: Insufficient documentation

## 2022-02-27 DIAGNOSIS — N920 Excessive and frequent menstruation with regular cycle: Secondary | ICD-10-CM | POA: Insufficient documentation

## 2022-02-27 DIAGNOSIS — N979 Female infertility, unspecified: Secondary | ICD-10-CM | POA: Insufficient documentation

## 2022-02-27 DIAGNOSIS — N926 Irregular menstruation, unspecified: Secondary | ICD-10-CM | POA: Insufficient documentation

## 2022-02-27 DIAGNOSIS — Z8349 Family history of other endocrine, nutritional and metabolic diseases: Secondary | ICD-10-CM

## 2022-02-27 HISTORY — DX: Family history of other endocrine, nutritional and metabolic diseases: Z83.49

## 2022-02-27 NOTE — Progress Notes (Signed)
? ?Office Visit Note ? ?Patient: April Holland             ?Date of Birth: 1986-05-13           ?MRN: [id number]             ?PCP: Collene Mares, PA ?Referring: Collene Mares, PA ?Visit Date: 02/28/2022 ? ? ?Subjective:  ?New Patient (Initial Visit) (Abnormal labs, positive ANA) ? ? ?History of Present Illness: April Holland is a 36 y.o. female here for evaluation of positive ANA and elevated sedimentation rate checked in association with abnormal liver function tests.  She is also had increased joint pain and stiffness in multiple areas.  She has sustained previous use related injury into the right foot and ankle and feels this has contributed to some left hip and knee pain when she is avoiding equal distribution of her weight.  She previously played basketball had some minor injuries from this more recently dance and weightlifting for physical activities.  She never required any significant surgeries major joint fractures or dislocations.  She does feel she has had a significant weight gain and decrease in her overall exercise and conditioning associated with COVID.  She does not notice any significant amount of visible or palpable joint swelling erythema or warmth.  Not having any new skin rashes, oral ulcers, lymphadenopathy, Raynaud's symptoms.  She has no history of blood clots or abnormal bleeding.  Does have history of goiter also labs reviewed showing normal thyroid function studies but moderately positive thyroperoxidase antibody tests. ? ? ?Activities of Daily Living:  ?Patient reports morning stiffness for 20 minutes.   ?Patient Denies nocturnal pain.  ?Difficulty dressing/grooming: Denies ?Difficulty climbing stairs: Denies ?Difficulty getting out of chair: Denies ?Difficulty using hands for taps, buttons, cutlery, and/or writing: Denies ? ?Review of Systems  ?Constitutional:  Negative for fatigue.  ?HENT:  Positive for mouth dryness.   ?Eyes:  Negative for dryness.  ?Respiratory:  Negative  for shortness of breath.   ?Cardiovascular:  Negative for swelling in legs/feet.  ?Gastrointestinal:  Positive for constipation.  ?Endocrine: Positive for excessive thirst.  ?Genitourinary:  Negative for difficulty urinating.  ?Musculoskeletal:  Positive for joint pain, gait problem, joint pain and morning stiffness.  ?Skin:  Negative for rash.  ?Allergic/Immunologic: Negative for susceptible to infections.  ?Neurological:  Negative for numbness.  ?Hematological:  Negative for bruising/bleeding tendency.  ?Psychiatric/Behavioral:  Negative for sleep disturbance.   ? ?PMFS History:  ?Patient Active Problem List  ? Diagnosis Date Noted  ? Positive ANA (antinuclear antibody) 02/28/2022  ? Menstrual disorder 02/27/2022  ? Family history of other endocrine, nutritional and metabolic diseases 02/27/2022  ? Menorrhagia 02/27/2022  ? Female infertility 02/27/2022  ? Prediabetes 02/27/2022  ? Vitamin D deficiency 02/27/2022  ? Essential hypertension 09/17/2021  ? Goiter 01/11/2013  ?  ?Past Medical History:  ?Diagnosis Date  ? Essential hypertension 09/17/2021  ? Hypertension   ?  ?Family History  ?Problem Relation Age of Onset  ? Thyroid disease Mother   ? Crohn's disease Mother   ? High Cholesterol Father   ? Hypertension Father   ? Stroke Maternal Grandmother   ? Heart failure Maternal Grandfather   ? Breast cancer Maternal Grandfather   ?     in 56's  ? Diabetes Maternal Grandfather   ? Stroke Paternal Grandmother   ? ?Past Surgical History:  ?Procedure Laterality Date  ? DILATATION & CURETTAGE/HYSTEROSCOPY WITH MYOSURE N/A 08/09/2020  ? Procedure: DILATATION &  CURETTAGE/HYSTEROSCOPY WITH MYOSURE;  Surgeon: Gerald Leitzole, Tara, MD;  Location: [facility];  Service: Gynecology;  Laterality: N/A;  ? WISDOM TOOTH EXTRACTION    ? ?Social History  ? ?Social History Narrative  ? Not on file  ? ? ?There is no immunization history on file for this patient.  ? ?Objective: ?Vital Signs: BP (!) 145/85 (BP Location: Right Arm,  Patient Position: Sitting, Cuff Size: Normal)   Pulse 87   Resp 15   Ht 5\' 8"  (1.727 m)   Wt 232 lb (105.2 kg)   BMI 35.28 kg/m?   ? ?Physical Exam ?Constitutional:   ?   Appearance: She is obese.  ?HENT:  ?   Right Ear: External ear normal.  ?   Left Ear: External ear normal.  ?   Mouth/Throat:  ?   Mouth: Mucous membranes are moist.  ?   Pharynx: Oropharynx is clear.  ?Eyes:  ?   Conjunctiva/sclera: Conjunctivae normal.  ?Cardiovascular:  ?   Rate and Rhythm: Normal rate and regular rhythm.  ?Pulmonary:  ?   Effort: Pulmonary effort is normal.  ?   Breath sounds: Normal breath sounds.  ?Musculoskeletal:  ?   Right lower leg: No edema.  ?   Left lower leg: No edema.  ?Lymphadenopathy:  ?   Cervical: No cervical adenopathy.  ?Skin: ?   General: Skin is warm and dry.  ?   Findings: No rash.  ?Neurological:  ?   General: No focal deficit present.  ?   Mental Status: She is alert.  ?   Deep Tendon Reflexes: Reflexes normal.  ?Psychiatric:     ?   Mood and Affect: Mood normal.  ?  ? ?Musculoskeletal Exam:  ?Shoulders full ROM no tenderness or swelling ?Elbows full ROM no tenderness or swelling ?Wrists full ROM no tenderness or swelling ?Fingers full ROM no tenderness or swelling ?Knees full ROM no tenderness or swelling ?Ankles full ROM no tenderness or swelling ? ? ?Investigation: ?No additional findings. ? ?Imaging: ?No results found. ? ?Recent Labs: ?No results found for: WBC, HGB, PLT, NA, K, CL, CO2, GLUCOSE, BUN, CREATININE, BILITOT, ALKPHOS, AST, ALT, PROT, ALBUMIN, CALCIUM, GFRAA, QFTBGOLD, QFTBGOLDPLUS ? ?Speciality Comments: No specialty comments available. ? ?Procedures:  ?No procedures performed ?Allergies: Elemental sulfur, Sulfa antibiotics, and Sulfamethoxazole  ? ?Assessment / Plan:     ?Visit Diagnoses: Positive ANA (antinuclear antibody) - Plan: Anti-Smith antibody, Sjogrens syndrome-A extractable nuclear antibody, Sjogrens syndrome-B extractable nuclear antibody, Anti-DNA antibody,  double-stranded, Sedimentation rate, RNP Antibody ? ?Joint pains and stiffness no particular inflammatory changes noted on exam today.  Checking specific extractable nuclear antigens as detailed above if these are significantly abnormal could consider trial of hydroxychloroquine versus observational approach.  If these are all negative I have a low suspicion for this representing a systemic connective tissue disease. ? ?Goiter ?Family history of other endocrine, nutritional and metabolic diseases ? ?He has some family history of Crohn disease and thyroid disease no specific rheumatology history.  Patients with significantly positive thyroperoxidase antibody titer can be a cause of positive ANA and a significant minority. ? ?Orders: ?Orders Placed This Encounter  ?Procedures  ? Anti-Smith antibody  ? Sjogrens syndrome-A extractable nuclear antibody  ? Sjogrens syndrome-B extractable nuclear antibody  ? Anti-DNA antibody, double-stranded  ? Sedimentation rate  ? RNP Antibody  ? ?No orders of the defined types were placed in this encounter. ? ? ? ?Follow-Up Instructions: No follow-ups on file. ? ? ?Jamesetta Orleanshristopher W  Amaya Blakeman, MD ? ?Note - This record has been created using [employer].  ?Chart creation errors have been sought, but may not always  ?have been located. Such creation errors do not reflect on  ?the standard of medical care. ? ?

## 2022-02-28 ENCOUNTER — Ambulatory Visit: Payer: No Typology Code available for payment source | Admitting: Internal Medicine

## 2022-02-28 ENCOUNTER — Encounter: Payer: Self-pay | Admitting: Internal Medicine

## 2022-02-28 VITALS — BP 145/85 | HR 87 | Resp 15 | Ht 68.0 in | Wt 232.0 lb

## 2022-02-28 DIAGNOSIS — E049 Nontoxic goiter, unspecified: Secondary | ICD-10-CM | POA: Diagnosis not present

## 2022-02-28 DIAGNOSIS — Z8349 Family history of other endocrine, nutritional and metabolic diseases: Secondary | ICD-10-CM | POA: Diagnosis not present

## 2022-02-28 DIAGNOSIS — R768 Other specified abnormal immunological findings in serum: Secondary | ICD-10-CM | POA: Diagnosis not present

## 2022-03-01 LAB — SEDIMENTATION RATE: Sed Rate: 14 mm/h (ref 0–20)

## 2022-03-01 LAB — RNP ANTIBODY: Ribonucleic Protein(ENA) Antibody, IgG: <1 AI

## 2022-03-01 LAB — ANTI-DNA ANTIBODY, DOUBLE-STRANDED: ds DNA Ab: 9 IU/mL — ABNORMAL HIGH

## 2022-03-01 LAB — SJOGRENS SYNDROME-A EXTRACTABLE NUCLEAR ANTIBODY: SSA (Ro) (ENA) Antibody, IgG: <1 AI

## 2022-03-01 LAB — SJOGRENS SYNDROME-B EXTRACTABLE NUCLEAR ANTIBODY: SSB (La) (ENA) Antibody, IgG: 1.1 AI — AB

## 2022-03-01 LAB — ANTI-SMITH ANTIBODY: ENA SM Ab Ser-aCnc: <1 AI

## 2022-03-04 NOTE — Progress Notes (Signed)
Ms. Mansouri has very borderline positive results for the ds DNA and SSB antibody markers. Combined with her benign exam I do not think these really indicate lupus. I don't recommend starting new treatment at this time. ?Sometimes early inflammatory problems can be very nonspecific. We could schedule a follow up in 6 months PRN to recheck these abnormal findings, if she is still having ongoing or new symptoms at that time.

## 2022-07-15 ENCOUNTER — Other Ambulatory Visit: Payer: Self-pay

## 2022-07-15 ENCOUNTER — Emergency Department (HOSPITAL_COMMUNITY): Payer: No Typology Code available for payment source

## 2022-07-15 ENCOUNTER — Encounter (HOSPITAL_COMMUNITY): Payer: Self-pay | Admitting: *Deleted

## 2022-07-15 ENCOUNTER — Inpatient Hospital Stay (HOSPITAL_COMMUNITY)
Admission: EM | Admit: 2022-07-15 | Discharge: 2022-07-20 | DRG: 759 | Disposition: A | Discharge status: Home / Self Care | Payer: No Typology Code available for payment source | Attending: Obstetrics and Gynecology | Admitting: Obstetrics and Gynecology

## 2022-07-15 DIAGNOSIS — R102 Pelvic and perineal pain: Secondary | ICD-10-CM | POA: Diagnosis not present

## 2022-07-15 DIAGNOSIS — N7093 Salpingitis and oophoritis, unspecified: Secondary | ICD-10-CM | POA: Diagnosis not present

## 2022-07-15 DIAGNOSIS — N80109 Endometriosis of ovary, unspecified side, unspecified depth: Secondary | ICD-10-CM | POA: Diagnosis present

## 2022-07-15 DIAGNOSIS — R7303 Prediabetes: Secondary | ICD-10-CM | POA: Diagnosis present

## 2022-07-15 DIAGNOSIS — I10 Essential (primary) hypertension: Secondary | ICD-10-CM | POA: Diagnosis present

## 2022-07-15 DIAGNOSIS — R5082 Postprocedural fever: Secondary | ICD-10-CM | POA: Diagnosis present

## 2022-07-15 DIAGNOSIS — R109 Unspecified abdominal pain: Secondary | ICD-10-CM | POA: Diagnosis present

## 2022-07-15 DIAGNOSIS — D649 Anemia, unspecified: Secondary | ICD-10-CM | POA: Diagnosis present

## 2022-07-15 HISTORY — DX: Prediabetes: R73.03

## 2022-07-15 LAB — COMPREHENSIVE METABOLIC PANEL
ALT: 84 U/L — ABNORMAL HIGH (ref 0–44)
AST: 49 U/L — ABNORMAL HIGH (ref 15–41)
Albumin: 3.3 g/dL — ABNORMAL LOW (ref 3.5–5.0)
Alkaline Phosphatase: 59 U/L (ref 38–126)
Anion gap: 15 (ref 5–15)
BUN: 6 mg/dL (ref 6–20)
CO2: 18 mmol/L — ABNORMAL LOW (ref 22–32)
Calcium: 8.7 mg/dL — ABNORMAL LOW (ref 8.9–10.3)
Chloride: 102 mmol/L (ref 98–111)
Creatinine, Ser: 1.06 mg/dL — ABNORMAL HIGH (ref 0.44–1.00)
GFR, Estimated: >60 mL/min (ref >60)
Glucose, Bld: 161 mg/dL — ABNORMAL HIGH (ref 70–99)
Potassium: 3.6 mmol/L (ref 3.5–5.1)
Sodium: 135 mmol/L (ref 135–145)
Total Bilirubin: 1.5 mg/dL — ABNORMAL HIGH (ref 0.3–1.2)
Total Protein: 7.5 g/dL (ref 6.5–8.1)

## 2022-07-15 LAB — WET PREP, GENITAL
Clue Cells Wet Prep HPF POC: NONE SEEN
Sperm: NONE SEEN
Trich, Wet Prep: NONE SEEN
WBC, Wet Prep HPF POC: <10 (ref <10)
Yeast Wet Prep HPF POC: NONE SEEN

## 2022-07-15 LAB — CBC WITH DIFFERENTIAL/PLATELET
Abs Immature Granulocytes: 0.04 10*3/uL (ref 0.00–0.07)
Basophils Absolute: 0 10*3/uL (ref 0.0–0.1)
Basophils Relative: 0 %
Eosinophils Absolute: 0 10*3/uL (ref 0.0–0.5)
Eosinophils Relative: 0 %
HCT: 32.3 % — ABNORMAL LOW (ref 36.0–46.0)
Hemoglobin: 10.2 g/dL — ABNORMAL LOW (ref 12.0–15.0)
Immature Granulocytes: 0 %
Lymphocytes Relative: 5 %
Lymphs Abs: 0.5 10*3/uL — ABNORMAL LOW (ref 0.7–4.0)
MCH: 27.1 pg (ref 26.0–34.0)
MCHC: 31.6 g/dL (ref 30.0–36.0)
MCV: 85.7 fL (ref 80.0–100.0)
Monocytes Absolute: 0.5 10*3/uL (ref 0.1–1.0)
Monocytes Relative: 4 %
Neutro Abs: 9.8 10*3/uL — ABNORMAL HIGH (ref 1.7–7.7)
Neutrophils Relative %: 91 %
Platelets: 362 10*3/uL (ref 150–400)
RBC: 3.77 MIL/uL — ABNORMAL LOW (ref 3.87–5.11)
RDW: 14.6 % (ref 11.5–15.5)
WBC: 10.9 10*3/uL — ABNORMAL HIGH (ref 4.0–10.5)
nRBC: 0 % (ref 0.0–0.2)

## 2022-07-15 LAB — I-STAT BETA HCG BLOOD, ED (MC, WL, AP ONLY): I-stat hCG, quantitative: <5 m[IU]/mL (ref <5)

## 2022-07-15 MED ORDER — ACETAMINOPHEN 325 MG PO TABS
650.0000 mg | ORAL_TABLET | Freq: Four times a day (QID) | ORAL | Status: DC | PRN
  Administered 2022-07-16 – 2022-07-20 (×8): 650 mg via ORAL
  Filled 2022-07-15 (×8): qty 2

## 2022-07-15 MED ORDER — FENTANYL CITRATE PF 50 MCG/ML IJ SOSY
25.0000 ug | PREFILLED_SYRINGE | Freq: Once | INTRAMUSCULAR | Status: AC
  Administered 2022-07-15: 25 ug via INTRAVENOUS
  Filled 2022-07-15: qty 1

## 2022-07-15 MED ORDER — SODIUM CHLORIDE 0.9 % IV SOLN
2.0000 g | INTRAVENOUS | Status: DC
  Administered 2022-07-16 – 2022-07-19 (×4): 2 g via INTRAVENOUS
  Filled 2022-07-15 (×5): qty 20

## 2022-07-15 MED ORDER — OXYCODONE-ACETAMINOPHEN 5-325 MG PO TABS
1.5000 | ORAL_TABLET | Freq: Once | ORAL | Status: AC
  Administered 2022-07-15: 1.5 via ORAL
  Filled 2022-07-15: qty 2

## 2022-07-15 MED ORDER — SODIUM CHLORIDE 0.9 % IV SOLN
1.0000 g | INTRAVENOUS | Status: DC
  Administered 2022-07-15: 1 g via INTRAVENOUS
  Filled 2022-07-15: qty 10

## 2022-07-15 MED ORDER — LACTATED RINGERS IV SOLN: INTRAVENOUS | Status: DC

## 2022-07-15 MED ORDER — IOHEXOL 350 MG/ML SOLN
75.0000 mL | Freq: Once | INTRAVENOUS | Status: AC | PRN
  Administered 2022-07-15: 75 mL via INTRAVENOUS

## 2022-07-15 MED ORDER — SODIUM CHLORIDE 0.9 % IV SOLN
1.0000 g | Freq: Once | INTRAVENOUS | Status: AC
  Administered 2022-07-15: 1 g via INTRAVENOUS
  Filled 2022-07-15: qty 10

## 2022-07-15 MED ORDER — METRONIDAZOLE 500 MG/100ML IV SOLN
500.0000 mg | Freq: Two times a day (BID) | INTRAVENOUS | Status: DC
  Administered 2022-07-15 – 2022-07-20 (×10): 500 mg via INTRAVENOUS
  Filled 2022-07-15 (×12): qty 100

## 2022-07-15 MED ORDER — OXYCODONE-ACETAMINOPHEN 5-325 MG PO TABS
1.0000 | ORAL_TABLET | Freq: Once | ORAL | Status: AC
  Administered 2022-07-15: 1 via ORAL
  Filled 2022-07-15: qty 1

## 2022-07-15 MED ORDER — MORPHINE SULFATE (PF) 2 MG/ML IV SOLN
2.0000 mg | Freq: Once | INTRAVENOUS | Status: AC
  Administered 2022-07-15: 2 mg via INTRAVENOUS
  Filled 2022-07-15: qty 1

## 2022-07-15 MED ORDER — ONDANSETRON 4 MG PO TBDP
4.0000 mg | ORAL_TABLET | Freq: Once | ORAL | Status: AC
  Administered 2022-07-15: 4 mg via ORAL
  Filled 2022-07-15: qty 1

## 2022-07-15 MED ORDER — ONDANSETRON HCL 4 MG/2ML IJ SOLN
4.0000 mg | Freq: Once | INTRAMUSCULAR | Status: AC | PRN
  Administered 2022-07-15: 4 mg via INTRAVENOUS
  Filled 2022-07-15: qty 2

## 2022-07-15 MED ORDER — DOXYCYCLINE HYCLATE 100 MG PO TABS
100.0000 mg | ORAL_TABLET | Freq: Once | ORAL | Status: AC
  Administered 2022-07-15: 100 mg via ORAL
  Filled 2022-07-15: qty 1

## 2022-07-15 MED ORDER — MORPHINE SULFATE (PF) 2 MG/ML IV SOLN
2.0000 mg | Freq: Once | INTRAVENOUS | Status: AC | PRN
  Administered 2022-07-15: 2 mg via INTRAVENOUS
  Filled 2022-07-15: qty 1

## 2022-07-15 MED ORDER — ONDANSETRON HCL 4 MG PO TABS: 4.0000 mg | ORAL_TABLET | Freq: Three times a day (TID) | ORAL | Status: DC | PRN

## 2022-07-15 MED ORDER — IBUPROFEN 600 MG PO TABS
600.0000 mg | ORAL_TABLET | Freq: Four times a day (QID) | ORAL | Status: DC | PRN
  Administered 2022-07-15 – 2022-07-20 (×4): 600 mg via ORAL
  Filled 2022-07-15 (×4): qty 1

## 2022-07-15 MED ORDER — OXYCODONE-ACETAMINOPHEN 5-325 MG PO TABS
1.0000 | ORAL_TABLET | Freq: Four times a day (QID) | ORAL | Status: DC | PRN
  Administered 2022-07-16 (×2): 2 via ORAL
  Filled 2022-07-15 (×2): qty 2

## 2022-07-15 NOTE — ED Notes (Signed)
ED TO INPATIENT HANDOFF REPORT  ED Nurse Name and Phone #: [phone]  S Name/Age/Gender April Holland 36 y.o. female Room/Bed: 012C/012C  Code Status   Code Status: Not on file  Home/SNF/Other Home Patient oriented to: self, place, time, and situation Is this baseline? Yes   Triage Complete: Triage complete  Chief Complaint Tubo-ovarian abscess [N70.93]  Triage Note Pt is working with fertility experts and is preparing for IVF.  She was placed on Letrozole and then started having heavy vaginal bleeding.  She called doctor and she was supposed to be placed on Nortrildrone also.  So she now has no vaginal bleeding but has increased abdominal pain and swelling. Last night she took Ibuprofen 600mg  for pain. She looks uncomfortable and states at that time she vomited.  Pt states abdominal discomfort is 9/10   Allergies Allergies  Allergen Reactions   Elemental Sulfur Rash   Sulfa Antibiotics Other (See Comments)   Sulfamethoxazole Rash    Level of Care/Admitting Diagnosis ED Disposition     ED Disposition  Admit   Condition  --   Comment  Hospital Area: [facility] [[id number]]  Level of Care: Med-Surg [[id number]]  May place patient in observation at [facility] or [facility] if equivalent level of care is available:: No  Covid Evaluation: Asymptomatic - no recent exposure (last 10 days) testing not required  Diagnosis: Tubo-ovarian abscess [id number]  Admitting Physician: [[id number]] [[id number]]  Attending Physician: Fermin Schwab [[id number]]  [facility]          B Medical/Surgery History Past Medical History:  Diagnosis Date   Essential hypertension 09/17/2021   Hypertension    Past Surgical History:  Procedure Laterality Date   DILATATION & CURETTAGE/HYSTEROSCOPY WITH MYOSURE N/A 08/09/2020   Procedure: DILATATION & CURETTAGE/HYSTEROSCOPY WITH MYOSURE;  Surgeon: 08/11/2020, MD;  Location: [facility];  Service:  Gynecology;  Laterality: N/A;   WISDOM TOOTH EXTRACTION       A IV Location/Drains/Wounds Patient Lines/Drains/Airways Status     Active Line/Drains/Airways     Name Placement date Placement time Site Days   Peripheral IV 07/15/22 20 G Right Antecubital 07/15/22  1142  Antecubital  less than 1   Incision (Closed) 08/09/20 Vagina Other (Comment) 08/09/20  1033  -- 705            Intake/Output Last 24 hours No intake or output data in the 24 hours ending 07/15/22 1337  Labs/Imaging Results for orders placed or performed during the hospital encounter of 07/15/22 (from the past 48 hour(s))  CBC with Differential     Status: Abnormal   Collection Time: 07/15/22  6:22 AM  Result Value Ref Range   WBC 10.9 (H) 4.0 - 10.5 K/uL   RBC 3.77 (L) 3.87 - 5.11 MIL/uL   Hemoglobin 10.2 (L) 12.0 - 15.0 g/dL   HCT 07/17/22 (L) 58.5 - 27.7 %   MCV 85.7 80.0 - 100.0 fL   MCH 27.1 26.0 - 34.0 pg   MCHC 31.6 30.0 - 36.0 g/dL   RDW 82.4 23.5 - 36.1 %   Platelets 362 150 - 400 K/uL   nRBC 0.0 0.0 - 0.2 %   Neutrophils Relative % 91 %   Neutro Abs 9.8 (H) 1.7 - 7.7 K/uL   Lymphocytes Relative 5 %   Lymphs Abs 0.5 (L) 0.7 - 4.0 K/uL   Monocytes Relative 4 %   Monocytes Absolute 0.5 0.1 - 1.0 K/uL  Eosinophils Relative 0 %   Eosinophils Absolute 0.0 0.0 - 0.5 K/uL   Basophils Relative 0 %   Basophils Absolute 0.0 0.0 - 0.1 K/uL   Immature Granulocytes 0 %   Abs Immature Granulocytes 0.04 0.00 - 0.07 K/uL    Comment: Performed at [facility] [address], [place], [place] [address]  Comprehensive metabolic panel     Status: Abnormal   Collection Time: 07/15/22  6:22 AM  Result Value Ref Range   Sodium 135 135 - 145 mmol/L   Potassium 3.6 3.5 - 5.1 mmol/L   Chloride 102 98 - 111 mmol/L   CO2 18 (L) 22 - 32 mmol/L   Glucose, Bld 161 (H) 70 - 99 mg/dL    Comment: Glucose reference range applies only to samples taken after fasting for at least 8 hours.   BUN 6 6 - 20 mg/dL    Creatinine, Ser 1.06 (H) 0.44 - 1.00 mg/dL   Calcium 8.7 (L) 8.9 - 10.3 mg/dL   Total Protein 7.5 6.5 - 8.1 g/dL   Albumin 3.3 (L) 3.5 - 5.0 g/dL   AST 49 (H) 15 - 41 U/L   ALT 84 (H) 0 - 44 U/L   Alkaline Phosphatase 59 38 - 126 U/L   Total Bilirubin 1.5 (H) 0.3 - 1.2 mg/dL   GFR, Estimated >60 >60 mL/min    Comment: (NOTE) Calculated using the CKD-EPI Creatinine Equation (2021)    Anion gap 15 5 - 15    Comment: Performed at [facility], [place] [address], [place],  [address]  I-Stat Beta hCG blood, ED ([facility], [facility], [facility] only)     Status: None   Collection Time: 07/15/22  6:41 AM  Result Value Ref Range   I-stat hCG, quantitative <5.0 <5 mIU/mL   Comment 3            Comment:   GEST. AGE      CONC.  (mIU/mL)   <=1 WEEK        5 - 50     2 WEEKS       50 - 500     3 WEEKS       100 - 10,000     4 WEEKS     1,000 - 30,000        FEMALE AND NON-PREGNANT FEMALE:     LESS THAN 5 mIU/mL    US PELVIC COMPLETE W TRANSVAGINAL AND TORSION R/O  Addendum Date: 07/15/2022   ADDENDUM REPORT: 07/15/2022 13:02 ADDENDUM: I was asked to review the pelvic ultrasound after the CT had been performed. There appears to be moderate mildly complicated fluid in the pelvis rather than the small amount of fluid described previously. On CT imaging, the fluid demonstrates attenuation of between 15 and 30 Hounsfield units. There appears to be a hemorrhagic cyst in the left ovary are previously described. In retrospect, there is a complicated/complex masslike collection either arising from or abutting the right ovary. This is along the left side of the right ovary best seen on image 52. On CT imaging, this masslike complex fluid collection measures 4.7 by 5.7 by 5 cm. IMPRESSION: 1. Taking both ultrasound and CT findings into account, the complex masslike fluid collection measuring 4.7 x 5.7 x 5 cm either arising from or immediately adjacent to the left side of the right ovary could represent a tubo-ovarian  abscess or an abscess immediately adjacent to the ovary. There is moderate complicated fluid  in the pelvis with an attenuation less than seen with acute blood products favored to be infectious or inflammatory. 2. Hemorrhagic cyst in the left ovary. 3. The uterus is unremarkable. Findings called to Dr. Rosalia Hammers Electronically Signed   By: Gerome Sam III M.D.   On: 07/15/2022 13:02   Result Date: 07/15/2022 CLINICAL DATA:  Pelvic pain for a day.  Patient on IVF treatment. EXAM: TRANSABDOMINAL AND TRANSVAGINAL ULTRASOUND OF PELVIS DOPPLER ULTRASOUND OF OVARIES TECHNIQUE: Both transabdominal and transvaginal ultrasound examinations of the pelvis were performed. Transabdominal technique was performed for global imaging of the pelvis including uterus, ovaries, adnexal regions, and pelvic cul-de-sac. It was necessary to proceed with endovaginal exam following the transabdominal exam to visualize the endometrium and ovaries. Color and duplex Doppler ultrasound was utilized to evaluate blood flow to the ovaries. COMPARISON:  None Available. FINDINGS: Uterus Measurements: 10.9 x 5.9 x 6.7 cm = volume: 226 mL. No fibroids or other mass visualized. Endometrium Thickness: 5.9 mm.  No focal abnormality visualized. Right ovary Measurements: 6 x 2.7 x 2.9 cm = volume: 24.8 mL. Normal appearance/no adnexal mass. Left ovary Measurements: 6 x 3.8 x 4.9 cm = volume: 58 mL. Normal appearance/no adnexal mass. Pulsed Doppler evaluation of both ovaries demonstrates normal low-resistance arterial and venous waveforms. Other findings Small amount of fluid is identified in the pelvis. IMPRESSION: 1. No significant abnormalities identified. The uterus, endometrium, and ovaries are unremarkable. No evidence of torsion. The small amount of fluid in the pelvis is likely physiologic. 2. There is a corpus luteum cyst in the right ovary and a hemorrhagic follicle on the left. Neither require follow-up. Electronically Signed: By: Gerome Sam  III M.D. On: 07/15/2022 08:25   CT ABDOMEN PELVIS W CONTRAST  Result Date: 07/15/2022 CLINICAL DATA:  Abdominal pain, acute, nonlocalized EXAM: CT ABDOMEN AND PELVIS WITH CONTRAST TECHNIQUE: Multidetector CT imaging of the abdomen and pelvis was performed using the standard protocol following bolus administration of intravenous contrast. RADIATION DOSE REDUCTION: This exam was performed according to the departmental dose-optimization program which includes automated exposure control, adjustment of the mA and/or kV according to patient size and/or use of iterative reconstruction technique. CONTRAST:  81mL OMNIPAQUE IOHEXOL 350 MG/ML SOLN COMPARISON:  None Available. FINDINGS: Lower chest: No acute abnormality. Hepatobiliary: No focal liver abnormality is seen. The gallbladder is unremarkable. Pancreas: Unremarkable. No pancreatic ductal dilatation or surrounding inflammatory changes. Spleen: Normal in size without focal abnormality. Adrenals/Urinary Tract: Adrenal glands are unremarkable. No hydronephrosis or nephrolithiasis. The bladder is mildly distended. Stomach/Bowel: The stomach is within normal limits. There is no evidence of bowel obstruction.The appendix is normal. Vascular/Lymphatic: Duplicated IVC.  No lymphadenopathy. Reproductive: There is rim enhancing fluid along the fundal and posterior uterus and bilateral adnexa. Rim enhancing collection along the left posterior uterus measuring 5.8 x 5.1 x 5.1 cm (series 3, image 74, series 7, image 78). There is extensive inflammatory stranding in the pelvis and small volume free fluid. Other: Small volume abdominal ascites.  No hernia.  No free air. Musculoskeletal: No acute osseous abnormality. No suspicious osseous lesion. IMPRESSION: Findings are most consistent with pelvic inflammatory disease with tubo-ovarian abscess along the left posterior uterus, measuring up to 5.8 x 5.1 x 5.1 cm. Extensive stranding in the pelvis and small volume abdominopelvic  ascites. A component of postprocedural change in the pelvis is possible, however, this would be a diagnosis of exclusion. Electronically Signed   By: Caprice Renshaw M.D.   On: 07/15/2022 12:50  Pending Labs Unresulted Labs (From admission, onward)    None       Vitals/Pain Today's Vitals   07/15/22 1230 07/15/22 1236 07/15/22 1236 07/15/22 1329  BP: (!) 148/91     Pulse: (!) 109     Resp:   18   Temp:      TempSrc:      SpO2: 100%     PainSc:  7  7  7      Isolation Precautions No active isolations  Medications Medications  cefTRIAXone (ROCEPHIN) 1 g in sodium chloride 0.9 % 100 mL IVPB (has no administration in time range)  doxycycline (VIBRA-TABS) tablet 100 mg (has no administration in time range)  metroNIDAZOLE (FLAGYL) IVPB 500 mg (has no administration in time range)  lactated ringers infusion (has no administration in time range)  ondansetron (ZOFRAN-ODT) disintegrating tablet 4 mg (4 mg Oral Given 07/15/22 0633)  oxyCODONE-acetaminophen (PERCOCET/ROXICET) 5-325 MG per tablet 1 tablet (1 tablet Oral Given 07/15/22 0633)  fentaNYL (SUBLIMAZE) injection 25 mcg (25 mcg Intravenous Given 07/15/22 1144)  iohexol (OMNIPAQUE) 350 MG/ML injection 75 mL (75 mLs Intravenous Contrast Given 07/15/22 1216)    Mobility walks Low fall risk   Focused Assessments    R Recommendations: See Admitting Provider Note  Report given to:   Additional Notes:

## 2022-07-15 NOTE — Progress Notes (Addendum)
RN notified during shift report that MD Kerin Perna has not been reachable on phone number [phone] that is listed as his contact. RN was able to reach MD on number [phone]. MD notified RN that the number listed as his contact is not the correct number. MD is en route to evaluate the patient.

## 2022-07-15 NOTE — Consult Note (Signed)
REI Admit Note  April Holland is an 36 y.o. female gravida 0 with known history of ovarian endometriosis and infertility as well as uterine adenomyosis causing frequent abnormal uterine bleeding episodes.  She underwent ovarian stimulation followed by an uneventful transvaginal ultrasound guided bilateral ovarian follicle aspiration for in vitro fertilization under my care on 07/02/2022.  She received preoperative prophylactic cefazolin 2 g IV.  She was supposed to go on letrozole plus danazol/Orilissa for continued suppression of endometriosis.  She had a period like bleeding 1 week later.  She describes increasing abdominal pain diffusely with a low-grade fever.  She describes normal bowel function.  She also had nausea and vomiting yesterday.     Past Medical History:  Diagnosis Date   Essential hypertension 09/17/2021   Hypertension    Prediabetes     Past Surgical History:  Procedure Laterality Date   DILATATION & CURETTAGE/HYSTEROSCOPY WITH MYOSURE N/A 08/09/2020   Procedure: DILATATION & CURETTAGE/HYSTEROSCOPY WITH MYOSURE;  Surgeon: Christophe Louis, MD;  Location: [facility];  Service: Gynecology;  Laterality: N/A;   HYSTEROSCOPY  2022   WISDOM TOOTH EXTRACTION      Family History  Problem Relation Age of Onset   Thyroid disease Mother    Crohn's disease Mother    High Cholesterol Father    Hypertension Father    Stroke Maternal Grandmother    Heart failure Maternal Grandfather    Breast cancer Maternal Grandfather        in 66's   Diabetes Maternal Grandfather    Stroke Paternal Grandmother     Social History:  reports that she has never smoked. She has never used smokeless tobacco. She reports current alcohol use. She reports that she does not use drugs.  Allergies:  Allergies  Allergen Reactions   Elemental Sulfur Rash   Sulfa Antibiotics Other (See Comments)   Sulfamethoxazole Rash    Medications: I have reviewed the patient's current  medications.  Review of Systems  Blood pressure 139/65, pulse (!) 119, temperature 98.5 F (36.9 C), temperature source Oral, resp. rate 18, height 5\' 8"  (1.727 m), weight 105.2 kg, SpO2 98 %. Physical Exam Patient in mild distress with abdominal pain  Abdomen: Soft, slightly distended, diffuse tenderness with rebound tenderness in all 4 quadrants.  Bowel sounds present Pelvic: Deferred Extremities: No clubbing cyanosis or edema   Results for orders placed or performed during the hospital encounter of 07/15/22 (from the past 48 hour(s))  CBC with Differential     Status: Abnormal   Collection Time: 07/15/22  6:22 AM  Result Value Ref Range   WBC 10.9 (H) 4.0 - 10.5 K/uL   RBC 3.77 (L) 3.87 - 5.11 MIL/uL   Hemoglobin 10.2 (L) 12.0 - 15.0 g/dL   HCT 32.3 (L) 36.0 - 46.0 %   MCV 85.7 80.0 - 100.0 fL   MCH 27.1 26.0 - 34.0 pg   MCHC 31.6 30.0 - 36.0 g/dL   RDW 14.6 11.5 - 15.5 %   Platelets 362 150 - 400 K/uL   nRBC 0.0 0.0 - 0.2 %   Neutrophils Relative % 91 %   Neutro Abs 9.8 (H) 1.7 - 7.7 K/uL   Lymphocytes Relative 5 %   Lymphs Abs 0.5 (L) 0.7 - 4.0 K/uL   Monocytes Relative 4 %   Monocytes Absolute 0.5 0.1 - 1.0 K/uL   Eosinophils Relative 0 %   Eosinophils Absolute 0.0 0.0 - 0.5 K/uL   Basophils Relative 0 %  Basophils Absolute 0.0 0.0 - 0.1 K/uL   Immature Granulocytes 0 %   Abs Immature Granulocytes 0.04 0.00 - 0.07 K/uL    Comment: Performed at [facility], [address] [place], [place] [address]  Comprehensive metabolic panel     Status: Abnormal   Collection Time: 07/15/22  6:22 AM  Result Value Ref Range   Sodium 135 135 - 145 mmol/L   Potassium 3.6 3.5 - 5.1 mmol/L   Chloride 102 98 - 111 mmol/L   CO2 18 (L) 22 - 32 mmol/L   Glucose, Bld 161 (H) 70 - 99 mg/dL    Comment: Glucose reference range applies only to samples taken after fasting for at least 8 hours.   BUN 6 6 - 20 mg/dL   Creatinine, Ser 7.61 (H) 0.44 - 1.00 mg/dL   Calcium 8.7 (L) 8.9  - 10.3 mg/dL   Total Protein 7.5 6.5 - 8.1 g/dL   Albumin 3.3 (L) 3.5 - 5.0 g/dL   AST 49 (H) 15 - 41 U/L   ALT 84 (H) 0 - 44 U/L   Alkaline Phosphatase 59 38 - 126 U/L   Total Bilirubin 1.5 (H) 0.3 - 1.2 mg/dL   GFR, Estimated >60 >73 mL/min    Comment: (NOTE) Calculated using the CKD-EPI Creatinine Equation (2021)    Anion gap 15 5 - 15    Comment: Performed at [facility], [address] [place], [place] [address]  I-Stat Beta hCG blood, ED ([facility], [facility], [facility] only)     Status: None   Collection Time: 07/15/22  6:41 AM  Result Value Ref Range   I-stat hCG, quantitative <5.0 <5 mIU/mL   Comment 3            Comment:   GEST. AGE      CONC.  (mIU/mL)   <=1 WEEK        5 - 50     2 WEEKS       50 - 500     3 WEEKS       100 - 10,000     4 WEEKS     1,000 - 30,000        FEMALE AND NON-PREGNANT FEMALE:     LESS THAN 5 mIU/mL   Wet prep, genital     Status: None   Collection Time: 07/15/22  1:35 PM   Specimen: PATH Cytology Cervicovaginal Ancillary Only  Result Value Ref Range   Yeast Wet Prep HPF POC NONE SEEN NONE SEEN   Trich, Wet Prep NONE SEEN NONE SEEN   Clue Cells Wet Prep HPF POC NONE SEEN NONE SEEN   WBC, Wet Prep HPF POC <10 <10   Sperm NONE SEEN     Comment: Performed at [facility], [address] [place], [place] [address]    US PELVIC COMPLETE W TRANSVAGINAL AND TORSION R/O  Addendum Date: 07/15/2022   ADDENDUM REPORT: 07/15/2022 13:02 ADDENDUM: I was asked to review the pelvic ultrasound after the CT had been performed. There appears to be moderate mildly complicated fluid in the pelvis rather than the small amount of fluid described previously. On CT imaging, the fluid demonstrates attenuation of between 15 and 30 Hounsfield units. There appears to be a hemorrhagic cyst in the left ovary are previously described. In retrospect, there is a complicated/complex masslike collection either arising from or abutting the right ovary. This is along the left side of  the right  ovary best seen on image 52. On CT imaging, this masslike complex fluid collection measures 4.7 by 5.7 by 5 cm. IMPRESSION: 1. Taking both ultrasound and CT findings into account, the complex masslike fluid collection measuring 4.7 x 5.7 x 5 cm either arising from or immediately adjacent to the left side of the right ovary could represent a tubo-ovarian abscess or an abscess immediately adjacent to the ovary. There is moderate complicated fluid in the pelvis with an attenuation less than seen with acute blood products favored to be infectious or inflammatory. 2. Hemorrhagic cyst in the left ovary. 3. The uterus is unremarkable. Findings called to Dr. Rosalia Hammers Electronically Signed   By: Gerome Sam III M.D.   On: 07/15/2022 13:02   Result Date: 07/15/2022 CLINICAL DATA:  Pelvic pain for a day.  Patient on IVF treatment. EXAM: TRANSABDOMINAL AND TRANSVAGINAL ULTRASOUND OF PELVIS DOPPLER ULTRASOUND OF OVARIES TECHNIQUE: Both transabdominal and transvaginal ultrasound examinations of the pelvis were performed. Transabdominal technique was performed for global imaging of the pelvis including uterus, ovaries, adnexal regions, and pelvic cul-de-sac. It was necessary to proceed with endovaginal exam following the transabdominal exam to visualize the endometrium and ovaries. Color and duplex Doppler ultrasound was utilized to evaluate blood flow to the ovaries. COMPARISON:  None Available. FINDINGS: Uterus Measurements: 10.9 x 5.9 x 6.7 cm = volume: 226 mL. No fibroids or other mass visualized. Endometrium Thickness: 5.9 mm.  No focal abnormality visualized. Right ovary Measurements: 6 x 2.7 x 2.9 cm = volume: 24.8 mL. Normal appearance/no adnexal mass. Left ovary Measurements: 6 x 3.8 x 4.9 cm = volume: 58 mL. Normal appearance/no adnexal mass. Pulsed Doppler evaluation of both ovaries demonstrates normal low-resistance arterial and venous waveforms. Other findings Small amount of fluid is identified in the  pelvis. IMPRESSION: 1. No significant abnormalities identified. The uterus, endometrium, and ovaries are unremarkable. No evidence of torsion. The small amount of fluid in the pelvis is likely physiologic. 2. There is a corpus luteum cyst in the right ovary and a hemorrhagic follicle on the left. Neither require follow-up. Electronically Signed: By: Gerome Sam III M.D. On: 07/15/2022 08:25   CT ABDOMEN PELVIS W CONTRAST  Result Date: 07/15/2022 CLINICAL DATA:  Abdominal pain, acute, nonlocalized EXAM: CT ABDOMEN AND PELVIS WITH CONTRAST TECHNIQUE: Multidetector CT imaging of the abdomen and pelvis was performed using the standard protocol following bolus administration of intravenous contrast. RADIATION DOSE REDUCTION: This exam was performed according to the departmental dose-optimization program which includes automated exposure control, adjustment of the mA and/or kV according to patient size and/or use of iterative reconstruction technique. CONTRAST:  31mL OMNIPAQUE IOHEXOL 350 MG/ML SOLN COMPARISON:  None Available. FINDINGS: Lower chest: No acute abnormality. Hepatobiliary: No focal liver abnormality is seen. The gallbladder is unremarkable. Pancreas: Unremarkable. No pancreatic ductal dilatation or surrounding inflammatory changes. Spleen: Normal in size without focal abnormality. Adrenals/Urinary Tract: Adrenal glands are unremarkable. No hydronephrosis or nephrolithiasis. The bladder is mildly distended. Stomach/Bowel: The stomach is within normal limits. There is no evidence of bowel obstruction.The appendix is normal. Vascular/Lymphatic: Duplicated IVC.  No lymphadenopathy. Reproductive: There is rim enhancing fluid along the fundal and posterior uterus and bilateral adnexa. Rim enhancing collection along the left posterior uterus measuring 5.8 x 5.1 x 5.1 cm (series 3, image 74, series 7, image 78). There is extensive inflammatory stranding in the pelvis and small volume free fluid. Other: Small  volume abdominal ascites.  No hernia.  No free air. Musculoskeletal: No acute osseous  abnormality. No suspicious osseous lesion. IMPRESSION: Findings are most consistent with pelvic inflammatory disease with tubo-ovarian abscess along the left posterior uterus, measuring up to 5.8 x 5.1 x 5.1 cm. Extensive stranding in the pelvis and small volume abdominopelvic ascites. A component of postprocedural change in the pelvis is possible, however, this would be a diagnosis of exclusion. Electronically Signed   By: Caprice Renshaw M.D.   On: 07/15/2022 12:50    Assessment/Plan: Delayed post-oocyte retrieval diffuse abdominal pain with history of endometriosis, rule out tubo-ovarian complex versus a leaking ovarian endometrioma I have personally reviewed patient's transvaginal ultrasound and CT of the abdomen and pelvis. I will favor infectious etiology for patient's significant abdominal pain and slightly elevated white blood count and treat her as for a tubo-ovarian abscess. We will monitor her clinical response and vital signs carefully and may get infectious disease consultation and or transvaginal ultrasound or CT-guided drainage of the fluid collection behind the uterus. I informed the patient about the diagnoses and the treatment plan.  Greer Ee April Manson 07/15/2022

## 2022-07-15 NOTE — ED Notes (Signed)
[facility] (Donella Stade, IVF coordinator and nurse, Dr. Kerin Perna, fertility MD) [phone]

## 2022-07-15 NOTE — ED Notes (Signed)
Patient transported to CT 

## 2022-07-15 NOTE — ED Provider Notes (Signed)
[facility] EMERGENCY DEPARTMENT Provider Note   CSN: [id number] Arrival date & time: 07/15/22  0450     History  Chief Complaint  Patient presents with   Abdominal Pain    April Holland is a 36 y.o. female.  HPI 36 year old female G0 P0, undergoing fertility treatments, presents today with abdominal pain that began on Friday.  She reports that she had egg retrieval 2 weeks ago.  She reports that she was then placed on letrozole.  She reports she had vaginal bleeding that began last Monday.  She then called the fertility specialist back and was started on Nortrel Dron.  The vaginal bleeding resolved.  Has had increased abdominal pain and swelling.  Denies nausea, vomiting, diarrhea.  Pain in her abdomen is 9 out of 10.     Home Medications Prior to Admission medications   Medication Sig Start Date End Date Taking? Authorizing Provider  Boric Acid Vaginal 600 MG SUPP 600 mg 07/16/18   [provider]  cholecalciferol (VITAMIN D3) 25 MCG (1000 UNIT) tablet Take 1,000 Units by mouth daily.    [provider]  ibuprofen (ADVIL) 800 MG tablet Take 1 tablet (800 mg total) by mouth every 8 (eight) hours as needed. 08/09/20   Christophe Louis, MD  labetalol (NORMODYNE) 100 MG tablet 2 tablets in the morning and 1 tablet at night Patient not taking: Reported on 02/28/2022 11/02/20   [provider]  levothyroxine (SYNTHROID) 25 MCG tablet Take 1 tablet by mouth daily at 12 noon. 12/09/21   [provider]  metFORMIN (GLUCOPHAGE) 500 MG tablet Take 500 mg by mouth 2 (two) times daily. 08/24/21   [provider]  Multiple Vitamin (MULTIVITAMIN WITH MINERALS) TABS tablet Take 1 tablet by mouth daily.    [provider]  tranexamic acid (LYSTEDA) 650 MG TABS tablet Take 1,300 mg by mouth 3 (three) times daily. Patient not taking: Reported on 01/24/2022 09/13/21   [provider]      Allergies    Elemental sulfur, Sulfa  antibiotics, and Sulfamethoxazole    Review of Systems   Review of Systems  Physical Exam Updated Vital Signs BP (!) 148/91   Pulse (!) 109   Temp 98.5 F (36.9 C) (Oral)   Resp 18   SpO2 100%  Physical Exam Vitals reviewed.  Constitutional:      General: She is not in acute distress.    Appearance: She is well-developed.  HENT:     Head: Normocephalic.     Mouth/Throat:     Mouth: Mucous membranes are moist.  Eyes:     Extraocular Movements: Extraocular movements intact.  Cardiovascular:     Rate and Rhythm: Regular rhythm. Tachycardia present.  Abdominal:     General: Bowel sounds are decreased. There is distension.     Tenderness: There is abdominal tenderness.     Comments: Abdomen is diffusely distended and diffusely tender to palpation  Genitourinary:    Vagina: Vaginal discharge present.     Cervix: Cervical motion tenderness present.     Uterus: Enlarged and tender.      Adnexa:        Right: Fullness present.        Left: Fullness present.   Skin:    General: Skin is warm and dry.  Neurological:     Mental Status: She is alert.     ED Results / Procedures / Treatments   Labs (all labs ordered are listed, but  only abnormal results are displayed) Labs Reviewed  CBC WITH DIFFERENTIAL/PLATELET - Abnormal; Notable for the following components:      Result Value   WBC 10.9 (*)    RBC 3.77 (*)    Hemoglobin 10.2 (*)    HCT 32.3 (*)    Neutro Abs 9.8 (*)    Lymphs Abs 0.5 (*)    All other components within normal limits  COMPREHENSIVE METABOLIC PANEL - Abnormal; Notable for the following components:   CO2 18 (*)    Glucose, Bld 161 (*)    Creatinine, Ser 1.06 (*)    Calcium 8.7 (*)    Albumin 3.3 (*)    AST 49 (*)    ALT 84 (*)    Total Bilirubin 1.5 (*)    All other components within normal limits  I-STAT BETA HCG BLOOD, ED ([facility], [facility], [facility] ONLY)    EKG None  Radiology US PELVIC COMPLETE W TRANSVAGINAL AND TORSION R/O  Addendum Date: 07/15/2022    ADDENDUM REPORT: 07/15/2022 13:02 ADDENDUM: I was asked to review the pelvic ultrasound after the CT had been performed. There appears to be moderate mildly complicated fluid in the pelvis rather than the small amount of fluid described previously. On CT imaging, the fluid demonstrates attenuation of between 15 and 30 Hounsfield units. There appears to be a hemorrhagic cyst in the left ovary are previously described. In retrospect, there is a complicated/complex masslike collection either arising from or abutting the right ovary. This is along the left side of the right ovary best seen on image 52. On CT imaging, this masslike complex fluid collection measures 4.7 by 5.7 by 5 cm. IMPRESSION: 1. Taking both ultrasound and CT findings into account, the complex masslike fluid collection measuring 4.7 x 5.7 x 5 cm either arising from or immediately adjacent to the left side of the right ovary could represent a tubo-ovarian abscess or an abscess immediately adjacent to the ovary. There is moderate complicated fluid in the pelvis with an attenuation less than seen with acute blood products favored to be infectious or inflammatory. 2. Hemorrhagic cyst in the left ovary. 3. The uterus is unremarkable. Findings called to Dr. Rosalia Hammers Electronically Signed   By: Gerome Sam III M.D.   On: 07/15/2022 13:02   Result Date: 07/15/2022 CLINICAL DATA:  Pelvic pain for a day.  Patient on IVF treatment. EXAM: TRANSABDOMINAL AND TRANSVAGINAL ULTRASOUND OF PELVIS DOPPLER ULTRASOUND OF OVARIES TECHNIQUE: Both transabdominal and transvaginal ultrasound examinations of the pelvis were performed. Transabdominal technique was performed for global imaging of the pelvis including uterus, ovaries, adnexal regions, and pelvic cul-de-sac. It was necessary to proceed with endovaginal exam following the transabdominal exam to visualize the endometrium and ovaries. Color and duplex Doppler ultrasound was utilized to evaluate blood flow to the  ovaries. COMPARISON:  None Available. FINDINGS: Uterus Measurements: 10.9 x 5.9 x 6.7 cm = volume: 226 mL. No fibroids or other mass visualized. Endometrium Thickness: 5.9 mm.  No focal abnormality visualized. Right ovary Measurements: 6 x 2.7 x 2.9 cm = volume: 24.8 mL. Normal appearance/no adnexal mass. Left ovary Measurements: 6 x 3.8 x 4.9 cm = volume: 58 mL. Normal appearance/no adnexal mass. Pulsed Doppler evaluation of both ovaries demonstrates normal low-resistance arterial and venous waveforms. Other findings Small amount of fluid is identified in the pelvis. IMPRESSION: 1. No significant abnormalities identified. The uterus, endometrium, and ovaries are unremarkable. No evidence of torsion. The small amount of fluid in the pelvis is likely  physiologic. 2. There is a corpus luteum cyst in the right ovary and a hemorrhagic follicle on the left. Neither require follow-up. Electronically Signed: By: Gerome Sam III M.D. On: 07/15/2022 08:25   CT ABDOMEN PELVIS W CONTRAST  Result Date: 07/15/2022 CLINICAL DATA:  Abdominal pain, acute, nonlocalized EXAM: CT ABDOMEN AND PELVIS WITH CONTRAST TECHNIQUE: Multidetector CT imaging of the abdomen and pelvis was performed using the standard protocol following bolus administration of intravenous contrast. RADIATION DOSE REDUCTION: This exam was performed according to the departmental dose-optimization program which includes automated exposure control, adjustment of the mA and/or kV according to patient size and/or use of iterative reconstruction technique. CONTRAST:  23mL OMNIPAQUE IOHEXOL 350 MG/ML SOLN COMPARISON:  None Available. FINDINGS: Lower chest: No acute abnormality. Hepatobiliary: No focal liver abnormality is seen. The gallbladder is unremarkable. Pancreas: Unremarkable. No pancreatic ductal dilatation or surrounding inflammatory changes. Spleen: Normal in size without focal abnormality. Adrenals/Urinary Tract: Adrenal glands are unremarkable. No  hydronephrosis or nephrolithiasis. The bladder is mildly distended. Stomach/Bowel: The stomach is within normal limits. There is no evidence of bowel obstruction.The appendix is normal. Vascular/Lymphatic: Duplicated IVC.  No lymphadenopathy. Reproductive: There is rim enhancing fluid along the fundal and posterior uterus and bilateral adnexa. Rim enhancing collection along the left posterior uterus measuring 5.8 x 5.1 x 5.1 cm (series 3, image 74, series 7, image 78). There is extensive inflammatory stranding in the pelvis and small volume free fluid. Other: Small volume abdominal ascites.  No hernia.  No free air. Musculoskeletal: No acute osseous abnormality. No suspicious osseous lesion. IMPRESSION: Findings are most consistent with pelvic inflammatory disease with tubo-ovarian abscess along the left posterior uterus, measuring up to 5.8 x 5.1 x 5.1 cm. Extensive stranding in the pelvis and small volume abdominopelvic ascites. A component of postprocedural change in the pelvis is possible, however, this would be a diagnosis of exclusion. Electronically Signed   By: Caprice Renshaw M.D.   On: 07/15/2022 12:50    Procedures Procedures    Medications Ordered in ED Medications  cefTRIAXone (ROCEPHIN) 1 g in sodium chloride 0.9 % 100 mL IVPB (has no administration in time range)  doxycycline (VIBRA-TABS) tablet 100 mg (has no administration in time range)  metroNIDAZOLE (FLAGYL) IVPB 500 mg (has no administration in time range)  lactated ringers infusion (has no administration in time range)  ondansetron (ZOFRAN-ODT) disintegrating tablet 4 mg (4 mg Oral Given 07/15/22 4132)  oxyCODONE-acetaminophen (PERCOCET/ROXICET) 5-325 MG per tablet 1 tablet (1 tablet Oral Given 07/15/22 4401)  fentaNYL (SUBLIMAZE) injection 25 mcg (25 mcg Intravenous Given 07/15/22 1144)  iohexol (OMNIPAQUE) 350 MG/ML injection 75 mL (75 mLs Intravenous Contrast Given 07/15/22 1216)    ED Course/ Medical Decision Making/ A&P                            Medical Decision Making 36 year old female G0 P0 undergoing fertility treatment presents today with pelvic pain that began several days ago.  Been worsening in nature.  She has had some associated nausea and vomiting.  She has a diffusely distended abdomen with diffuse tenderness here in the ED.  She underwent imaging with ultrasound and CT scan.  Differential diagnosis includes pelvic infection, other acute intra-abdominal etiologies, and complications of fertility treatment. As patient was found to have complex tubo-ovarian mass consistent with tubo-ovarian abscess, IV antibiotics of Rocephin, p.o. doxycycline, and Flagyl are started here in the ED. Patient is mildly tachycardic and is  given IV fluids Discussed with radiology Patient's fertility specialist  was called and I am awaiting callback from Dr. April Manson at [phone] Dr. April Manson will admit. Temporary admit orders placed  Amount and/or Complexity of Data Reviewed Radiology: ordered and independent interpretation performed. Decision-making details documented in ED Course.    Details: Received call from radiologist, Dr. Mayford Knife,  who read pelvic ultrasound and discussed ultrasound and CT findings.  There is concern for a tubo-ovarian abscess along the left posterior uterus measuring up to 5 x 5 x 5 cm.  There is extensive stranding noted.   Risk Prescription drug management.           Final Clinical Impression(s) / ED Diagnoses Final diagnoses:  Pelvic pain    Rx / DC Orders ED Discharge Orders     None         Margarita Grizzle, MD 07/15/22 1335

## 2022-07-15 NOTE — ED Triage Notes (Signed)
Pt is working with [employer] and is preparing for IVF.  She was placed on Letrozole and then started having heavy vaginal bleeding.  She called doctor and she was supposed to be placed on Nortrildrone also.  So she now has no vaginal bleeding but has increased abdominal pain and swelling. Last night she took Ibuprofen 600mg  for pain. She looks uncomfortable and states at that time she vomited.  Pt states abdominal discomfort is 9/10

## 2022-07-15 NOTE — ED Provider Triage Note (Signed)
Emergency Medicine Provider Triage Evaluation Note  April Holland , a 36 y.o. female  was evaluated in triage.  Pt who is currently undergoing treatment for IVF who presents with concern for progressively worsening bilateral pelvic pain and abdominal distention now with nausea and vomiting this evening.  No fevers or vaginal bleeding at this time.  She is 1 week status post a retrieval currently on letrozole and Nortrildone per patient.  Was having vaginal bleeding earlier in the week now resolved.  Review of Systems  Positive: Pelvic pain, nausea vomiting Negative: Vaginal bleeding  Physical Exam  BP 116/68 (BP Location: Right Arm)   Pulse (!) 114   Temp 100 F (37.8 C) (Oral)   Resp 17   SpO2 100%  Gen:   Awake, tearful, visibly uncomfortable Resp:  Normal effort  MSK:   Moves extremities without difficulty  Other:  Exquisite tenderness palpation in the lower abdomen bilaterally, symmetrically  Medical Decision Making  Medically screening exam initiated at 6:23 AM.  Appropriate orders placed.  LAYAN ZALENSKI was informed that the remainder of the evaluation will be completed by another provider, this initial triage assessment does not replace that evaluation, and the importance of remaining in the ED until their evaluation is complete.  Concern for ovarian hyperstimulation syndrome in context of ovarian stimulation for egg retrieval in the past week.  Case discussed with MAU APP who states patient has not within their protocol for transfer to the MAU; case also discussed with Dr. Elgie Congo, on-call OB/GYN attending who agrees with plan for labs and pelvic ultrasound with Dopplers at this time to evaluate for possible OHSS.  Appreciate their collaboration in care of this patient.  This chart was dictated using voice recognition software, Dragon. Despite the best efforts of this provider to proofread and correct errors, errors may still occur which can change documentation meaning.     Emeline Darling, PA-C 07/15/22 [phone]

## 2022-07-16 DIAGNOSIS — D649 Anemia, unspecified: Secondary | ICD-10-CM | POA: Diagnosis present

## 2022-07-16 DIAGNOSIS — N80109 Endometriosis of ovary, unspecified side, unspecified depth: Secondary | ICD-10-CM | POA: Diagnosis present

## 2022-07-16 DIAGNOSIS — R5082 Postprocedural fever: Secondary | ICD-10-CM | POA: Diagnosis present

## 2022-07-16 DIAGNOSIS — R102 Pelvic and perineal pain: Secondary | ICD-10-CM | POA: Diagnosis present

## 2022-07-16 DIAGNOSIS — I10 Essential (primary) hypertension: Secondary | ICD-10-CM | POA: Diagnosis present

## 2022-07-16 DIAGNOSIS — R7303 Prediabetes: Secondary | ICD-10-CM | POA: Diagnosis present

## 2022-07-16 DIAGNOSIS — N7093 Salpingitis and oophoritis, unspecified: Secondary | ICD-10-CM | POA: Diagnosis present

## 2022-07-16 LAB — CBC WITH DIFFERENTIAL/PLATELET
Abs Immature Granulocytes: 0.08 10*3/uL — ABNORMAL HIGH (ref 0.00–0.07)
Basophils Absolute: 0 10*3/uL (ref 0.0–0.1)
Basophils Relative: 0 %
Eosinophils Absolute: 0 10*3/uL (ref 0.0–0.5)
Eosinophils Relative: 0 %
HCT: 29.6 % — ABNORMAL LOW (ref 36.0–46.0)
Hemoglobin: 9.5 g/dL — ABNORMAL LOW (ref 12.0–15.0)
Immature Granulocytes: 1 %
Lymphocytes Relative: 8 %
Lymphs Abs: 1 10*3/uL (ref 0.7–4.0)
MCH: 27.5 pg (ref 26.0–34.0)
MCHC: 32.1 g/dL (ref 30.0–36.0)
MCV: 85.5 fL (ref 80.0–100.0)
Monocytes Absolute: 0.8 10*3/uL (ref 0.1–1.0)
Monocytes Relative: 6 %
Neutro Abs: 10.2 10*3/uL — ABNORMAL HIGH (ref 1.7–7.7)
Neutrophils Relative %: 85 %
Platelets: 303 10*3/uL (ref 150–400)
RBC: 3.46 MIL/uL — ABNORMAL LOW (ref 3.87–5.11)
RDW: 14.9 % (ref 11.5–15.5)
WBC: 12.2 10*3/uL — ABNORMAL HIGH (ref 4.0–10.5)
nRBC: 0 % (ref 0.0–0.2)

## 2022-07-16 LAB — GC/CHLAMYDIA PROBE AMP (~~LOC~~) NOT AT ARMC
Chlamydia: NEGATIVE
Comment: NEGATIVE
Comment: NORMAL
Neisseria Gonorrhea: NEGATIVE

## 2022-07-16 MED ORDER — DIPHENHYDRAMINE HCL 50 MG/ML IJ SOLN: 12.5000 mg | Freq: Four times a day (QID) | INTRAMUSCULAR | Status: DC | PRN

## 2022-07-16 MED ORDER — ONDANSETRON HCL 4 MG/2ML IJ SOLN: 4.0000 mg | Freq: Four times a day (QID) | INTRAMUSCULAR | Status: DC | PRN

## 2022-07-16 MED ORDER — NALOXONE HCL 0.4 MG/ML IJ SOLN: 0.4000 mg | INTRAMUSCULAR | Status: DC | PRN

## 2022-07-16 MED ORDER — SODIUM CHLORIDE 0.9% FLUSH: 9.0000 mL | INTRAVENOUS | Status: DC | PRN

## 2022-07-16 MED ORDER — HYDROMORPHONE 1 MG/ML IV SOLN
INTRAVENOUS | Status: DC
  Administered 2022-07-17: 1 mg via INTRAVENOUS
  Administered 2022-07-17: 1.3 mg via INTRAVENOUS
  Administered 2022-07-17 – 2022-07-18 (×3): 30 mg via INTRAVENOUS
  Administered 2022-07-18: 0.2 mg via INTRAVENOUS
  Administered 2022-07-18: 2 mg via INTRAVENOUS
  Administered 2022-07-18: 0.6 mg via INTRAVENOUS
  Administered 2022-07-19: 1.4 mg via INTRAVENOUS
  Administered 2022-07-19: 1.6 mg via INTRAVENOUS
  Administered 2022-07-19: 1 mg via INTRAVENOUS
  Administered 2022-07-19: 1.6 mg via INTRAVENOUS
  Filled 2022-07-16: qty 30

## 2022-07-16 MED ORDER — ENOXAPARIN SODIUM 40 MG/0.4ML IJ SOSY
40.0000 mg | PREFILLED_SYRINGE | INTRAMUSCULAR | Status: DC
  Administered 2022-07-16 – 2022-07-19 (×4): 40 mg via SUBCUTANEOUS
  Filled 2022-07-16 (×4): qty 0.4

## 2022-07-16 MED ORDER — DIPHENHYDRAMINE HCL 12.5 MG/5ML PO ELIX: 12.5000 mg | ORAL_SOLUTION | Freq: Four times a day (QID) | ORAL | Status: DC | PRN

## 2022-07-16 MED ORDER — DOXYCYCLINE HYCLATE 100 MG PO TABS
100.0000 mg | ORAL_TABLET | Freq: Two times a day (BID) | ORAL | Status: DC
  Administered 2022-07-16 – 2022-07-18 (×5): 100 mg via ORAL
  Filled 2022-07-16 (×5): qty 1

## 2022-07-16 NOTE — Progress Notes (Signed)
April Holland is a 36 y.o. female patient. 1. Pelvic pain    Past Medical History:  Diagnosis Date   Essential hypertension 09/17/2021   Hypertension    Prediabetes    Current Facility-Administered Medications  Medication Dose Route Frequency Provider Last Rate Last Admin   acetaminophen (TYLENOL) tablet 650 mg  650 mg Oral Q6H PRN Fermin Schwab, MD   650 mg at 07/16/22 0001   cefTRIAXone (ROCEPHIN) 2 g in sodium chloride 0.9 % 100 mL IVPB  2 g Intravenous Q24H Margarita Grizzle, MD 200 mL/hr at 07/16/22 1401 2 g at 07/16/22 1401   diphenhydrAMINE (BENADRYL) injection 12.5 mg  12.5 mg Intravenous Q6H PRN Fermin Schwab, MD       Or   diphenhydrAMINE (BENADRYL) 12.5 MG/5ML elixir 12.5 mg  12.5 mg Oral Q6H PRN Fermin Schwab, MD       doxycycline (VIBRA-TABS) tablet 100 mg  100 mg Oral Q12H Fermin Schwab, MD   100 mg at 07/16/22 0940   enoxaparin (LOVENOX) injection 40 mg  40 mg Subcutaneous Q24H Fermin Schwab, MD       HYDROmorphone (DILAUDID) 1 mg/mL PCA injection   Intravenous Q4H Fermin Schwab, MD       ibuprofen (ADVIL) tablet 600 mg  600 mg Oral Q6H PRN Fermin Schwab, MD   600 mg at 07/16/22 0940   lactated ringers infusion   Intravenous Continuous Margarita Grizzle, MD 100 mL/hr at 07/15/22 1836 New Bag at 07/15/22 1836   metroNIDAZOLE (FLAGYL) IVPB 500 mg  500 mg Intravenous Q12H Margarita Grizzle, MD 100 mL/hr at 07/16/22 1233 500 mg at 07/16/22 1233   naloxone (NARCAN) injection 0.4 mg  0.4 mg Intravenous PRN Fermin Schwab, MD       And   sodium chloride flush (NS) 0.9 % injection 9 mL  9 mL Intravenous PRN Fermin Schwab, MD       ondansetron [facility]) injection 4 mg  4 mg Intravenous Q6H PRN Fermin Schwab, MD       ondansetron [facility]) tablet 4 mg  4 mg Oral Q8H PRN Fermin Schwab, MD       oxyCODONE-acetaminophen (PERCOCET/ROXICET) 5-325 MG per tablet 1-2 tablet  1-2 tablet Oral Q6H PRN Fermin Schwab, MD   2 tablet at 07/16/22 0940   Allergies   Allergen Reactions   Elemental Sulfur Rash   Sulfa Antibiotics Other (See Comments)   Sulfamethoxazole Rash   Principal Problem:   Tubo-ovarian abscess  Blood pressure 139/72, pulse (!) 117, temperature 100 F (37.8 C), temperature source Oral, resp. rate 18, height 5\' 8"  (1.727 m), weight 105.2 kg, SpO2 98 %.  Subjective  Patient continues to complain of generalized abdominal pain.  Passing flatus.  No vomiting.  Objective: Vital signs: (most recent): Blood pressure 139/72, pulse (!) 117, temperature 100 F (37.8 C), temperature source Oral, resp. rate 18, height 5\' 8"  (1.727 m), weight 105.2 kg, SpO2 98 %.    Assessment & Plan  Low-grade temperature with leukocytosis and generalized abdominal pain continues with suspected tubo-ovarian abscess on IV antibiotics x24 hours We will consult ID for appropriateness of antibiotic choice We will consult IR for possible drainage of the 5 x 4 cm complex posterior pelvic fluid collection/tubo-ovarian complex Continue IV antibiotics We will switch from Percocet/7.5/325 to PCA Dilaudid      07/16/2022

## 2022-07-17 LAB — CBC WITH DIFFERENTIAL/PLATELET
Abs Immature Granulocytes: 0.16 10*3/uL — ABNORMAL HIGH (ref 0.00–0.07)
Basophils Absolute: 0 10*3/uL (ref 0.0–0.1)
Basophils Relative: 0 %
Eosinophils Absolute: 0 10*3/uL (ref 0.0–0.5)
Eosinophils Relative: 0 %
HCT: 28.2 % — ABNORMAL LOW (ref 36.0–46.0)
Hemoglobin: 9.1 g/dL — ABNORMAL LOW (ref 12.0–15.0)
Immature Granulocytes: 1 %
Lymphocytes Relative: 9 %
Lymphs Abs: 1.3 10*3/uL (ref 0.7–4.0)
MCH: 27.3 pg (ref 26.0–34.0)
MCHC: 32.3 g/dL (ref 30.0–36.0)
MCV: 84.7 fL (ref 80.0–100.0)
Monocytes Absolute: 0.8 10*3/uL (ref 0.1–1.0)
Monocytes Relative: 6 %
Neutro Abs: 11.9 10*3/uL — ABNORMAL HIGH (ref 1.7–7.7)
Neutrophils Relative %: 84 %
Platelets: 321 10*3/uL (ref 150–400)
RBC: 3.33 MIL/uL — ABNORMAL LOW (ref 3.87–5.11)
RDW: 15.1 % (ref 11.5–15.5)
WBC: 14.1 10*3/uL — ABNORMAL HIGH (ref 4.0–10.5)
nRBC: 0 % (ref 0.0–0.2)

## 2022-07-17 LAB — BASIC METABOLIC PANEL
Anion gap: 6 (ref 5–15)
BUN: 6 mg/dL (ref 6–20)
CO2: 29 mmol/L (ref 22–32)
Calcium: 8 mg/dL — ABNORMAL LOW (ref 8.9–10.3)
Chloride: 101 mmol/L (ref 98–111)
Creatinine, Ser: 1.04 mg/dL — ABNORMAL HIGH (ref 0.44–1.00)
GFR, Estimated: >60 mL/min (ref >60)
Glucose, Bld: 119 mg/dL — ABNORMAL HIGH (ref 70–99)
Potassium: 3.6 mmol/L (ref 3.5–5.1)
Sodium: 136 mmol/L (ref 135–145)

## 2022-07-17 NOTE — Progress Notes (Signed)
Dr. Kerin Perna contacted regarding increased temps. He request that ID be contacted to figure out next phase of care.

## 2022-07-17 NOTE — Progress Notes (Signed)
Spoke to infectious disease and discussed patient history and symptoms along with medications and course of care. Advised that patient is receiving appropriate care at this time. No new orders.

## 2022-07-17 NOTE — Progress Notes (Signed)
Call and message left for Dr. Kerin Perna to discuss if he had contacted ID physician to physician for consult. Left message.

## 2022-07-17 NOTE — Progress Notes (Signed)
Called for sepsis in a female that had ovarian cyst aspiration a couple weeks ago. Imaging with TOA.   On doxy ceftriaxone flagyl Not hypotensive    Continue abx as is Agree with blood culture Id will formally see in am

## 2022-07-17 NOTE — Progress Notes (Signed)
Subjective: Interval History: Abdominal pain was well controlled with low-dose PCA today.  Patient feels better.  Objective: Vital signs in last 24 hours: Temp:  [99.3 F (37.4 C)-103.3 F (39.6 C)] 99.3 F (37.4 C) (09/27 2004) Pulse Rate:  [114-124] 115 (09/27 2004) Resp:  [16-32] 18 (09/27 2004) BP: (130-146)/(63-89) 136/68 (09/27 2004) SpO2:  [91 %-100 %] 100 % (09/27 2004)  Intake/Output from previous day: 09/26 0701 - 09/27 0700 In: -  Out: 1300 [Urine:1300] Intake/Output this shift: No intake/output data recorded.  Eyes: negative findings: conjunctivae and sclerae normal Nose: Nares normal. Septum midline. Mucosa normal. No drainage or sinus tenderness., no discharge Throat: lips, mucosa, and tongue normal; teeth and gums normal Neck: no adenopathy, no carotid bruit, no JVD, supple, symmetrical, trachea midline, and thyroid not enlarged, symmetric, no tenderness/mass/nodules Lungs: clear to auscultation bilaterally Heart: regular rate and rhythm Abdomen: soft, non-tender; bowel sounds normal; no masses,  no organomegaly Pelvic: external genitalia normal and no cervical motion tenderness  Results for orders placed or performed during the hospital encounter of 07/15/22 (from the past 24 hour(s))  CBC with Differential/Platelet     Status: Abnormal   Collection Time: 07/17/22  6:10 AM  Result Value Ref Range   WBC 14.1 (H) 4.0 - 10.5 K/uL   RBC 3.33 (L) 3.87 - 5.11 MIL/uL   Hemoglobin 9.1 (L) 12.0 - 15.0 g/dL   HCT 28.2 (L) 36.0 - 46.0 %   MCV 84.7 80.0 - 100.0 fL   MCH 27.3 26.0 - 34.0 pg   MCHC 32.3 30.0 - 36.0 g/dL   RDW 15.1 11.5 - 15.5 %   Platelets 321 150 - 400 K/uL   nRBC 0.0 0.0 - 0.2 %   Neutrophils Relative % 84 %   Neutro Abs 11.9 (H) 1.7 - 7.7 K/uL   Lymphocytes Relative 9 %   Lymphs Abs 1.3 0.7 - 4.0 K/uL   Monocytes Relative 6 %   Monocytes Absolute 0.8 0.1 - 1.0 K/uL   Eosinophils Relative 0 %   Eosinophils Absolute 0.0 0.0 - 0.5 K/uL    Basophils Relative 0 %   Basophils Absolute 0.0 0.0 - 0.1 K/uL   Immature Granulocytes 1 %   Abs Immature Granulocytes 0.16 (H) 0.00 - 0.07 K/uL  Basic metabolic panel     Status: Abnormal   Collection Time: 07/17/22  6:10 AM  Result Value Ref Range   Sodium 136 135 - 145 mmol/L   Potassium 3.6 3.5 - 5.1 mmol/L   Chloride 101 98 - 111 mmol/L   CO2 29 22 - 32 mmol/L   Glucose, Bld 119 (H) 70 - 99 mg/dL   BUN 6 6 - 20 mg/dL   Creatinine, Ser 1.04 (H) 0.44 - 1.00 mg/dL   Calcium 8.0 (L) 8.9 - 10.3 mg/dL   GFR, Estimated >60 >60 mL/min   Anion gap 6 5 - 15    Studies/Results: US PELVIC COMPLETE W TRANSVAGINAL AND TORSION R/O  Addendum Date: 07/15/2022   ADDENDUM REPORT: 07/15/2022 13:02 ADDENDUM: I was asked to review the pelvic ultrasound after the CT had been performed. There appears to be moderate mildly complicated fluid in the pelvis rather than the small amount of fluid described previously. On CT imaging, the fluid demonstrates attenuation of between 15 and 30 Hounsfield units. There appears to be a hemorrhagic cyst in the left ovary are previously described. In retrospect, there is a complicated/complex masslike collection either arising from or abutting the right ovary. This is  along the left side of the right ovary best seen on image 52. On CT imaging, this masslike complex fluid collection measures 4.7 by 5.7 by 5 cm. IMPRESSION: 1. Taking both ultrasound and CT findings into account, the complex masslike fluid collection measuring 4.7 x 5.7 x 5 cm either arising from or immediately adjacent to the left side of the right ovary could represent a tubo-ovarian abscess or an abscess immediately adjacent to the ovary. There is moderate complicated fluid in the pelvis with an attenuation less than seen with acute blood products favored to be infectious or inflammatory. 2. Hemorrhagic cyst in the left ovary. 3. The uterus is unremarkable. Findings called to Dr. Jeanell Sparrow Electronically Signed   By:  Dorise Bullion III M.D.   On: 07/15/2022 13:02   Result Date: 07/15/2022 CLINICAL DATA:  Pelvic pain for a day.  Patient on IVF treatment. EXAM: TRANSABDOMINAL AND TRANSVAGINAL ULTRASOUND OF PELVIS DOPPLER ULTRASOUND OF OVARIES TECHNIQUE: Both transabdominal and transvaginal ultrasound examinations of the pelvis were performed. Transabdominal technique was performed for global imaging of the pelvis including uterus, ovaries, adnexal regions, and pelvic cul-de-sac. It was necessary to proceed with endovaginal exam following the transabdominal exam to visualize the endometrium and ovaries. Color and duplex Doppler ultrasound was utilized to evaluate blood flow to the ovaries. COMPARISON:  None Available. FINDINGS: Uterus Measurements: 10.9 x 5.9 x 6.7 cm = volume: 226 mL. No fibroids or other mass visualized. Endometrium Thickness: 5.9 mm.  No focal abnormality visualized. Right ovary Measurements: 6 x 2.7 x 2.9 cm = volume: 24.8 mL. Normal appearance/no adnexal mass. Left ovary Measurements: 6 x 3.8 x 4.9 cm = volume: 58 mL. Normal appearance/no adnexal mass. Pulsed Doppler evaluation of both ovaries demonstrates normal low-resistance arterial and venous waveforms. Other findings Small amount of fluid is identified in the pelvis. IMPRESSION: 1. No significant abnormalities identified. The uterus, endometrium, and ovaries are unremarkable. No evidence of torsion. The small amount of fluid in the pelvis is likely physiologic. 2. There is a corpus luteum cyst in the right ovary and a hemorrhagic follicle on the left. Neither require follow-up. Electronically Signed: By: Dorise Bullion III M.D. On: 07/15/2022 08:25   CT ABDOMEN PELVIS W CONTRAST  Result Date: 07/15/2022 CLINICAL DATA:  Abdominal pain, acute, nonlocalized EXAM: CT ABDOMEN AND PELVIS WITH CONTRAST TECHNIQUE: Multidetector CT imaging of the abdomen and pelvis was performed using the standard protocol following bolus administration of intravenous  contrast. RADIATION DOSE REDUCTION: This exam was performed according to the departmental dose-optimization program which includes automated exposure control, adjustment of the mA and/or kV according to patient size and/or use of iterative reconstruction technique. CONTRAST:  33mL OMNIPAQUE IOHEXOL 350 MG/ML SOLN COMPARISON:  None Available. FINDINGS: Lower chest: No acute abnormality. Hepatobiliary: No focal liver abnormality is seen. The gallbladder is unremarkable. Pancreas: Unremarkable. No pancreatic ductal dilatation or surrounding inflammatory changes. Spleen: Normal in size without focal abnormality. Adrenals/Urinary Tract: Adrenal glands are unremarkable. No hydronephrosis or nephrolithiasis. The bladder is mildly distended. Stomach/Bowel: The stomach is within normal limits. There is no evidence of bowel obstruction.The appendix is normal. Vascular/Lymphatic: Duplicated IVC.  No lymphadenopathy. Reproductive: There is rim enhancing fluid along the fundal and posterior uterus and bilateral adnexa. Rim enhancing collection along the left posterior uterus measuring 5.8 x 5.1 x 5.1 cm (series 3, image 74, series 7, image 78). There is extensive inflammatory stranding in the pelvis and small volume free fluid. Other: Small volume abdominal ascites.  No hernia.  No free air. Musculoskeletal: No acute osseous abnormality. No suspicious osseous lesion. IMPRESSION: Findings are most consistent with pelvic inflammatory disease with tubo-ovarian abscess along the left posterior uterus, measuring up to 5.8 x 5.1 x 5.1 cm. Extensive stranding in the pelvis and small volume abdominopelvic ascites. A component of postprocedural change in the pelvis is possible, however, this would be a diagnosis of exclusion. Electronically Signed   By: Maurine Simmering M.D.   On: 07/15/2022 12:50    Scheduled Meds:  doxycycline  100 mg Oral Q12H   enoxaparin (LOVENOX) injection  40 mg Subcutaneous Q24H   HYDROmorphone   Intravenous Q4H    Continuous Infusions:  cefTRIAXone (ROCEPHIN)  IV 2 g (07/17/22 1509)   lactated ringers 100 mL/hr at 07/16/22 2124   metronidazole 500 mg (07/17/22 1237)   PRN Meds:acetaminophen, diphenhydrAMINE **OR** diphenhydrAMINE, ibuprofen, naloxone **AND** sodium chloride flush, ondansetron (ZOFRAN) IV, ondansetron, oxyCODONE-acetaminophen  Assessment/Plan: Patient subjectively better with tubo-ovarian abscess status post bilateral transvaginal oocyte retrieval resulting tubo-ovarian complex, however occasional febrile morbidity and WBC elevation is concerning. IR consultation and preliminary ID over the are appreciated We will continue IV antibiotics and PCA for abdominal pain   LOS: 1 day   Yuvonne Lanahan Kerin Perna

## 2022-07-17 NOTE — Progress Notes (Signed)
IR was requested for image guided aspiration and drain placement for TOA.   Case was reviewed by Dr. Denna Haggard, the abscess is not amenable for percutaneous approach.    Ordering provider notified.   Will delete the drain placement order.  Please call IR for questions and concerns.   Armando Gang Orvan Papadakis PA-C 07/17/2022 8:05 AM

## 2022-07-18 DIAGNOSIS — N7093 Salpingitis and oophoritis, unspecified: Secondary | ICD-10-CM

## 2022-07-18 LAB — CBC WITH DIFFERENTIAL/PLATELET
Abs Immature Granulocytes: 0.11 10*3/uL — ABNORMAL HIGH (ref 0.00–0.07)
Basophils Absolute: 0 10*3/uL (ref 0.0–0.1)
Basophils Relative: 0 %
Eosinophils Absolute: 0 10*3/uL (ref 0.0–0.5)
Eosinophils Relative: 0 %
HCT: 26.2 % — ABNORMAL LOW (ref 36.0–46.0)
Hemoglobin: 8.6 g/dL — ABNORMAL LOW (ref 12.0–15.0)
Immature Granulocytes: 1 %
Lymphocytes Relative: 9 %
Lymphs Abs: 1.2 10*3/uL (ref 0.7–4.0)
MCH: 27.3 pg (ref 26.0–34.0)
MCHC: 32.8 g/dL (ref 30.0–36.0)
MCV: 83.2 fL (ref 80.0–100.0)
Monocytes Absolute: 1.2 10*3/uL — ABNORMAL HIGH (ref 0.1–1.0)
Monocytes Relative: 9 %
Neutro Abs: 10.6 10*3/uL — ABNORMAL HIGH (ref 1.7–7.7)
Neutrophils Relative %: 81 %
Platelets: 344 10*3/uL (ref 150–400)
RBC: 3.15 MIL/uL — ABNORMAL LOW (ref 3.87–5.11)
RDW: 15.5 % (ref 11.5–15.5)
WBC: 13.1 10*3/uL — ABNORMAL HIGH (ref 4.0–10.5)
nRBC: 0 % (ref 0.0–0.2)

## 2022-07-18 MED ORDER — VANCOMYCIN HCL 2000 MG/400ML IV SOLN
2000.0000 mg | Freq: Once | INTRAVENOUS | Status: AC
  Administered 2022-07-18: 2000 mg via INTRAVENOUS
  Filled 2022-07-18: qty 400

## 2022-07-18 MED ORDER — VANCOMYCIN HCL 1750 MG/350ML IV SOLN
1750.0000 mg | INTRAVENOUS | Status: DC
  Administered 2022-07-19: 1750 mg via INTRAVENOUS
  Filled 2022-07-18 (×2): qty 350

## 2022-07-18 NOTE — Consult Note (Signed)
[facility] for Infectious Disease    Date of Admission:  07/15/2022     Reason for Consult: TOA, sepsis    Referring Provider: Kerin Perna    Abx: 9/28-c doxy 9/28-c ceftriaxone 9/28-c metronidazole         Assessment: 36 yo female with severe endometriosis, s/p bilateral ovarian egg sampling for fertility planning, then started on letrozole/norethindrone admitted for sepsis/pelvic pain found to have left TOA   Ir consulted but due to location of abscess unable to drain  Her std testing is negative for gc/chlam  She is on appropriate abx but still have fever. However clinically feeling better. I suspect she is still within the windows of 3-5 days of abx where she could still have fever  I do not at this time see an alternative explanation to her fever  I had asked for blood culture which is to be obtained today (after 3 days abx) to see if we are not covering a microbe with current regimen. As I have low suspicion this is std, I will change doxy to vancomycin for now  Plan: Stop doxy Start vanc Continue metronidazole and ceftriaxone F/u bcx Discussed with primary team    I spent 75 minute reviewing data/chart, and coordinating care and >50% direct face to face time providing counseling/discussing diagnostics/treatment plan with patient       ------------------------------------------------ Principal Problem:   Tubo-ovarian abscess Active Problems:   Abdominal pain with fever after surgery    HPI: April Holland is a 36 y.o. female with severe endometriosis, s/p bilateral ovarian egg sampling for fertility planning, then started on letrozole/norethindrone admitted for sepsis/pelvic pain found to have left TOA   Hx via chart review corroborated with patient who is an excellent historian  Patient did well post ovarian egg sampling. She received estrogen prior to that. After egg sampling she was started on letrozole which then lead to vaginal  bleeding and norethindrone was added with good result  However a few days prior to admission started having lower pelvic cramping, decreased appetite, and subjective f/c  She met sepsis criteria on admission with fever Ct pelvis showed left TOA of 5 cm Ir consulted but unable to drain due to location No bcx yet but still have fever on doxy/ceftriaxone/flagyl  A pelvic exam was done by ob and there was no sign suggestive of PID Vaginal gc/chlam negative  Her last fever was last night. She said she is feeling better though  Family History  Problem Relation Age of Onset   Thyroid disease Mother    Crohn's disease Mother    High Cholesterol Father    Hypertension Father    Stroke Maternal Grandmother    Heart failure Maternal Grandfather    Breast cancer Maternal Grandfather        in 42's   Diabetes Maternal Grandfather    Stroke Paternal Grandmother     Social History   Tobacco Use   Smoking status: Never   Smokeless tobacco: Never  Vaping Use   Vaping Use: Never used  Substance Use Topics   Alcohol use: Yes    Comment: social   Drug use: No    Allergies  Allergen Reactions   Elemental Sulfur Rash   Sulfa Antibiotics Other (See Comments)   Sulfamethoxazole Rash    Review of Systems: ROS All Other ROS was negative, except mentioned above   Past Medical History:  Diagnosis Date   Essential hypertension 09/17/2021  Hypertension    Prediabetes        Scheduled Meds:  doxycycline  100 mg Oral Q12H   enoxaparin (LOVENOX) injection  40 mg Subcutaneous Q24H   HYDROmorphone   Intravenous Q4H   Continuous Infusions:  cefTRIAXone (ROCEPHIN)  IV 2 g (07/17/22 1509)   lactated ringers 100 mL/hr at 07/18/22 0101   metronidazole 500 mg (07/18/22 1253)   PRN Meds:.acetaminophen, diphenhydrAMINE **OR** diphenhydrAMINE, ibuprofen, naloxone **AND** sodium chloride flush, ondansetron (ZOFRAN) IV, ondansetron, oxyCODONE-acetaminophen   OBJECTIVE: Blood pressure  (!) 142/79, pulse (!) 103, temperature 99.3 F (37.4 C), temperature source Oral, resp. rate (!) 29, height _0  (1.727 m), weight 105.2 kg, SpO2 95 %.  Physical Exam  General/constitutional: no distress, pleasant HEENT: Normocephalic, PER, Conj Clear, EOMI, Oropharynx clear Neck supple CV: rrr no mrg Lungs: clear to auscultation, normal respiratory effort Abd: Soft, Nontender Ext: no edema Skin: No Rash Neuro: nonfocal MSK: no peripheral joint swelling/tenderness/warmth; back spines nontender     Lab Results Lab Results  Component Value Date   WBC 13.1 (H) 07/18/2022   HGB 8.6 (L) 07/18/2022   HCT 26.2 (L) 07/18/2022   MCV 83.2 07/18/2022   PLT 344 07/18/2022    Lab Results  Component Value Date   CREATININE 1.04 (H) 07/17/2022   BUN 6 07/17/2022   NA 136 07/17/2022   K 3.6 07/17/2022   CL 101 07/17/2022   CO2 29 07/17/2022    Lab Results  Component Value Date   ALT 84 (H) 07/15/2022   AST 49 (H) 07/15/2022   ALKPHOS 59 07/15/2022   BILITOT 1.5 (H) 07/15/2022      Microbiology: Recent Results (from the past 240 hour(s))  Wet prep, genital     Status: None   Collection Time: 07/15/22  1:35 PM   Specimen: PATH Cytology Cervicovaginal Ancillary Only  Result Value Ref Range Status   Yeast Wet Prep HPF POC NONE SEEN NONE SEEN Final   Trich, Wet Prep NONE SEEN NONE SEEN Final   Clue Cells Wet Prep HPF POC NONE SEEN NONE SEEN Final   WBC, Wet Prep HPF POC <10 <10 Final   Sperm NONE SEEN  Final    Comment: Performed at [facility], [address], [place], [place] [address]     Serology:    Imaging: If present, new imagings (plain films, ct scans, and mri) have been personally visualized and interpreted; radiology reports have been reviewed. Decision making incorporated into the Impression / Recommendations.  9/25 ct abd pelv Findings are most consistent with pelvic inflammatory disease with tubo-ovarian abscess along the left posterior uterus,  measuring up to 5.8 x 5.1 x 5.1 cm. Extensive stranding in the pelvis and small volume abdominopelvic ascites. A component of postprocedural change in the pelvis is possible, however, this would be a diagnosis of exclusion.  Jabier Mutton, [facility] [place] [phone] pager    07/18/2022, 1:37 PM

## 2022-07-18 NOTE — Progress Notes (Signed)
Pharmacy Antibiotic Note  April Holland is a 36 y.o. female admitted on 07/15/2022 with left tubo-ovarian abscess.  Has been on ceftriaxone/flagyl/doxy for ~ 3 days and continued to fever. Blood cultures are pending. Pharmacy has been consulted for vancomycin dosing. WBC 13.1 and patient Tmax 103.3.   Plan: Vancomycin 2000 mg X 1 then 1750 mg every 24 hours  Predicted AUC 505 with Scr 1.04  Continue ceftriaxone/flagyl  D/C Doxycycline Monitor blood cultures, fever, clinical status    Height: 5\' 8"  (172.7 cm) Weight: 105.2 kg (232 lb) IBW/kg (Calculated) : 63.9  Temp (24hrs), Avg:100.3 F (37.9 C), Min:98.3 F (36.8 C), Max:103.3 F (39.6 C)  Recent Labs  Lab 07/15/22 0622 07/16/22 0539 07/17/22 0610 07/18/22 0709  WBC 10.9* 12.2* 14.1* 13.1*  CREATININE 1.06*  --  1.04*  --     Estimated Creatinine Clearance: 93.1 mL/min (A) (by C-G formula based on SCr of 1.04 mg/dL (H)).    Allergies  Allergen Reactions   Elemental Sulfur Rash   Sulfa Antibiotics Other (See Comments)   Sulfamethoxazole Rash     Thank you for allowing pharmacy to be a part of this patient's care.  Jimmy Footman, PharmD, BCPS, [place] [facility] Clinical Pharmacist Phone: [phone] 07/18/2022 2:03 PM

## 2022-07-19 ENCOUNTER — Other Ambulatory Visit (HOSPITAL_COMMUNITY): Payer: Self-pay

## 2022-07-19 DIAGNOSIS — N7093 Salpingitis and oophoritis, unspecified: Secondary | ICD-10-CM | POA: Diagnosis not present

## 2022-07-19 LAB — ABO/RH: ABO/RH(D): O POS

## 2022-07-19 LAB — CBC WITH DIFFERENTIAL/PLATELET
Abs Immature Granulocytes: 0 10*3/uL (ref 0.00–0.07)
Basophils Absolute: 0 10*3/uL (ref 0.0–0.1)
Basophils Relative: 0 %
Eosinophils Absolute: 0.1 10*3/uL (ref 0.0–0.5)
Eosinophils Relative: 1 %
HCT: 26.1 % — ABNORMAL LOW (ref 36.0–46.0)
Hemoglobin: 8.6 g/dL — ABNORMAL LOW (ref 12.0–15.0)
Lymphocytes Relative: 10 %
Lymphs Abs: 1.3 10*3/uL (ref 0.7–4.0)
MCH: 27.2 pg (ref 26.0–34.0)
MCHC: 33 g/dL (ref 30.0–36.0)
MCV: 82.6 fL (ref 80.0–100.0)
Monocytes Absolute: 0.5 10*3/uL (ref 0.1–1.0)
Monocytes Relative: 4 %
Neutro Abs: 11.2 10*3/uL — ABNORMAL HIGH (ref 1.7–7.7)
Neutrophils Relative %: 85 %
Platelets: 379 10*3/uL (ref 150–400)
RBC: 3.16 MIL/uL — ABNORMAL LOW (ref 3.87–5.11)
RDW: 15.8 % — ABNORMAL HIGH (ref 11.5–15.5)
WBC: 13.2 10*3/uL — ABNORMAL HIGH (ref 4.0–10.5)
nRBC: 0 % (ref 0.0–0.2)

## 2022-07-19 LAB — TYPE AND SCREEN
ABO/RH(D): O POS
Antibody Screen: NEGATIVE

## 2022-07-19 MED ORDER — FERROUS SULFATE 325 (65 FE) MG PO TABS
325.0000 mg | ORAL_TABLET | ORAL | Status: DC
  Administered 2022-07-20: 325 mg via ORAL
  Filled 2022-07-19: qty 1

## 2022-07-19 MED ORDER — TRAMADOL-ACETAMINOPHEN 37.5-325 MG PO TABS: 1.0000 | ORAL_TABLET | ORAL | Status: DC | PRN

## 2022-07-19 MED ORDER — AMOXICILLIN-POT CLAVULANATE 875-125 MG PO TABS
1.0000 | ORAL_TABLET | Freq: Two times a day (BID) | ORAL | 0 refills | Status: AC
  Filled 2022-07-19: qty 60, 30d supply, fill #0

## 2022-07-19 MED ORDER — OXYCODONE-ACETAMINOPHEN 5-325 MG PO TABS: 1.0000 | ORAL_TABLET | Freq: Four times a day (QID) | ORAL | Status: DC | PRN

## 2022-07-19 NOTE — Progress Notes (Signed)
April Holland is a 36 y.o. female patient. Tubo-ovarian abscess  Past Medical History:  Diagnosis Date   Essential hypertension 09/17/2021   Hypertension    Prediabetes    Current Facility-Administered Medications  Medication Dose Route Frequency Provider Last Rate Last Admin   acetaminophen (TYLENOL) tablet 650 mg  650 mg Oral Q6H PRN Governor Specking, MD   650 mg at 07/19/22 2029   cefTRIAXone (ROCEPHIN) 2 g in sodium chloride 0.9 % 100 mL IVPB  2 g Intravenous Q24H Pattricia Boss, MD 200 mL/hr at 07/19/22 1415 2 g at 07/19/22 1415   enoxaparin (LOVENOX) injection 40 mg  40 mg Subcutaneous Q24H Governor Specking, MD   40 mg at 07/19/22 2029   [START ON 07/20/2022] ferrous sulfate tablet 325 mg  325 mg Oral Jeananne Rama, MD       ibuprofen (ADVIL) tablet 600 mg  600 mg Oral Q6H PRN Governor Specking, MD   600 mg at 07/19/22 1648   lactated ringers infusion   Intravenous Continuous Pattricia Boss, MD 100 mL/hr at 07/19/22 2142 New Bag at 07/19/22 2142   metroNIDAZOLE (FLAGYL) IVPB 500 mg  500 mg Intravenous Q12H Pattricia Boss, MD 100 mL/hr at 07/19/22 1245 500 mg at 07/19/22 1245   ondansetron (ZOFRAN) tablet 4 mg  4 mg Oral Q8H PRN Governor Specking, MD       oxyCODONE-acetaminophen (PERCOCET/ROXICET) 5-325 MG per tablet 1 tablet  1 tablet Oral Q6H PRN Governor Specking, MD       traMADol-acetaminophen (ULTRACET) 37.5-325 MG per tablet 1 tablet  1 tablet Oral Q4H PRN Governor Specking, MD       vancomycin Alcus Dad) IVPB 1750 mg/350 mL  1,750 mg Intravenous Q24H Susa Raring, RPH 175 mL/hr at 07/19/22 1552 1,750 mg at 07/19/22 1552   Allergies  Allergen Reactions   Elemental Sulfur Rash   Sulfa Antibiotics Other (See Comments)   Sulfamethoxazole Rash   Principal Problem:   Tubo-ovarian abscess Active Problems:   Abdominal pain with fever after surgery  Blood pressure 133/73, pulse 89, temperature 98.1 F (36.7 C), temperature source Oral, resp. rate (!) 24, height  5\' 8"  (1.727 m), weight 105.2 kg, SpO2 100 %.  Subjective Patient feels better today with less abdominal pain; she had to use the PCA 1 time today Objective: Vital signs: (most recent): Blood pressure 133/73, pulse 89, temperature 98.1 F (36.7 C), temperature source Oral, resp. rate (!) 24, height 5\' 8"  (1.727 m), weight 105.2 kg, SpO2 100 %.   Abdomen: Soft, slight tenderness, no rebound tenderness, no masses Assessment & Plan Improved status of tubo-ovarian abscess, status post transvaginal ultrasound guided needle aspiration of bilateral ovarian follicles for IVF, in the background of ovarian endometriomas So far blood cultures negative Infectious disease consult follow-up appreciated. Will discharge patient home tomorrow on Augmentin as well as endometriosis suppression medications letrozole plus norethindrone acetate For anemia, she will take ferrous sulfate every other day as per pharmacy recommendations I will follow-up in my [place] office in 1 week. Patient was informed about the plan.  [name] 07/19/2022

## 2022-07-19 NOTE — Progress Notes (Signed)
[facility]  Date of Admission:  07/15/2022      Abx: 9/28-c vanc 9/25-c metronidazole 9/25-c ceftriaxone   9/25-28 doxy  ASSESSMENT: TOA post ovarian sampling; 5 cm abscess on ct scan Endometriosis  Sepsis resolved as of 9/28. Clinically doing better 9/28 bcx while on abx ngtd If continued improvement by tomorrow, given negative std testing (and low risk), can discharge on amox-clav Planning 6 weeks abx on hand and will see in [facility] to determine further treatment course/evaluation    PLAN: Continue vanc/ceftriaxone/flagyl for now If continued improvement tomorrow and blood culture remains negative, could discharge from id standpoint Abx on discharge amox-clav 875 mg po bid for 6 weeks (please give on hand) from 9/30-10/11 Discussed with primary team    Clinic Follow Up Appt: 10/18 @ 945 with dr Renold Don  @  [facility] [address], [place], [place] [address] Phone: [phone]  I spent more than 35 minute reviewing data/chart, and coordinating care and >50% direct face to face time providing counseling/discussing diagnostics/treatment plan with patient   Principal Problem:   Tubo-ovarian abscess Active Problems:   Abdominal pain with fever after surgery   Allergies  Allergen Reactions   Elemental Sulfur Rash   Sulfa Antibiotics Other (See Comments)   Sulfamethoxazole Rash    Scheduled Meds:  enoxaparin (LOVENOX) injection  40 mg Subcutaneous Q24H   HYDROmorphone   Intravenous Q4H   Continuous Infusions:  cefTRIAXone (ROCEPHIN)  IV Stopped (07/18/22 1502)   lactated ringers 100 mL/hr at 07/18/22 1856   metronidazole 500 mg (07/19/22 1245)   vancomycin     PRN Meds:.acetaminophen, diphenhydrAMINE **OR** diphenhydrAMINE, ibuprofen, naloxone **AND** sodium chloride flush, ondansetron (ZOFRAN) IV, ondansetron, oxyCODONE-acetaminophen   SUBJECTIVE: Improving overall including lower abd spasm No fever,  chill  No n/v/diarrhea  9/28 bcx ngtd  Review of Systems: ROS All other ROS was negative, except mentioned above     OBJECTIVE: Vitals:   07/19/22 0850 07/19/22 0855 07/19/22 0930 07/19/22 0934  BP:   (!) 140/81   Pulse:   99   Resp:   (!) 22 (!) 22  Temp:   98.7 F (37.1 C)   TempSrc:   Oral   SpO2: 96% 97% 98% 98%  Weight:      Height:       Body mass index is 35.28 kg/m.  Physical Exam  General/constitutional: no distress, pleasant HEENT: Normocephalic, PER, Conj Clear, EOMI, Oropharynx clear Neck supple CV: rrr no mrg Lungs: clear to auscultation, normal respiratory effort Abd: Soft, Nontender Ext: no edema Skin: No Rash Neuro: nonfocal MSK: no peripheral joint swelling/tenderness/warmth; back spines nontender    Lab Results Lab Results  Component Value Date   WBC 13.2 (H) 07/19/2022   HGB 8.6 (L) 07/19/2022   HCT 26.1 (L) 07/19/2022   MCV 82.6 07/19/2022   PLT 379 07/19/2022    Lab Results  Component Value Date   CREATININE 1.04 (H) 07/17/2022   BUN 6 07/17/2022   NA 136 07/17/2022   K 3.6 07/17/2022   CL 101 07/17/2022   CO2 29 07/17/2022    Lab Results  Component Value Date   ALT 84 (H) 07/15/2022   AST 49 (H) 07/15/2022   ALKPHOS 59 07/15/2022   BILITOT 1.5 (H) 07/15/2022      Microbiology: Recent Results (from the past 240 hour(s))  Wet prep, genital     Status: None   Collection  Time: 07/15/22  1:35 PM   Specimen: PATH Cytology Cervicovaginal Ancillary Only  Result Value Ref Range Status   Yeast Wet Prep HPF POC NONE SEEN NONE SEEN Final   Trich, Wet Prep NONE SEEN NONE SEEN Final   Clue Cells Wet Prep HPF POC NONE SEEN NONE SEEN Final   WBC, Wet Prep HPF POC <10 <10 Final   Sperm NONE SEEN  Final    Comment: Performed at [facility], [place] [address], [place], [place] [address]  Culture, blood (Routine X 2) w Reflex to ID Panel     Status: None (Preliminary result)   Collection Time: 07/18/22 10:48 AM   Specimen:  BLOOD LEFT ARM  Result Value Ref Range Status   Specimen Description BLOOD LEFT ARM  Final   Special Requests   Final    BOTTLES DRAWN AEROBIC AND ANAEROBIC Blood Culture adequate volume   Culture   Final    NO GROWTH < 24 HOURS Performed at [facility], [place] [address], [place], [place] [address]    Report Status PENDING  Incomplete  Culture, blood (Routine X 2) w Reflex to ID Panel     Status: None (Preliminary result)   Collection Time: 07/18/22 10:48 AM   Specimen: BLOOD LEFT ARM  Result Value Ref Range Status   Specimen Description BLOOD LEFT ARM  Final   Special Requests   Final    BOTTLES DRAWN AEROBIC AND ANAEROBIC Blood Culture adequate volume   Culture   Final    NO GROWTH < 24 HOURS Performed at [facility], [place] [address], [place],  [address]    Report Status PENDING  Incomplete     Serology:   Imaging: If present, new imagings (plain films, ct scans, and mri) have been personally visualized and interpreted; radiology reports have been reviewed. Decision making incorporated into the Impression / Recommendations.  9/25 ct abd pelv Findings are most consistent with pelvic inflammatory disease with tubo-ovarian abscess along the left posterior uterus, measuring up to 5.8 x 5.1 x 5.1 cm. Extensive stranding in the pelvis and small volume abdominopelvic ascites. A component of postprocedural change in the pelvis is possible, however, this would be a diagnosis of exclusion.   Jabier Mutton, [place] [facility] [place] [phone] pager    07/19/2022, 12:51 PM

## 2022-07-19 NOTE — Plan of Care (Signed)

## 2022-07-20 ENCOUNTER — Other Ambulatory Visit: Payer: Self-pay | Admitting: Internal Medicine

## 2022-07-20 LAB — CBC WITH DIFFERENTIAL/PLATELET
Abs Immature Granulocytes: 0.1 10*3/uL — ABNORMAL HIGH (ref 0.00–0.07)
Basophils Absolute: 0 10*3/uL (ref 0.0–0.1)
Basophils Relative: 0 %
Eosinophils Absolute: 0.2 10*3/uL (ref 0.0–0.5)
Eosinophils Relative: 2 %
HCT: 25 % — ABNORMAL LOW (ref 36.0–46.0)
Hemoglobin: 8.3 g/dL — ABNORMAL LOW (ref 12.0–15.0)
Lymphocytes Relative: 11 %
Lymphs Abs: 1.1 10*3/uL (ref 0.7–4.0)
MCH: 27.7 pg (ref 26.0–34.0)
MCHC: 33.2 g/dL (ref 30.0–36.0)
MCV: 83.3 fL (ref 80.0–100.0)
Monocytes Absolute: 0.8 10*3/uL (ref 0.1–1.0)
Monocytes Relative: 8 %
Myelocytes: 1 %
Neutro Abs: 7.7 10*3/uL (ref 1.7–7.7)
Neutrophils Relative %: 78 %
Platelets: 395 10*3/uL (ref 150–400)
RBC: 3 MIL/uL — ABNORMAL LOW (ref 3.87–5.11)
RDW: 15.9 % — ABNORMAL HIGH (ref 11.5–15.5)
WBC: 9.9 10*3/uL (ref 4.0–10.5)
nRBC: 0 % (ref 0.0–0.2)
nRBC: 0 /100 WBC

## 2022-07-20 MED ORDER — FERROUS SULFATE 325 (65 FE) MG PO TABS: 325.0000 mg | ORAL_TABLET | ORAL | 3 refills | Status: DC

## 2022-07-20 MED ORDER — TRAMADOL HCL 50 MG PO TABS: 50.0000 mg | ORAL_TABLET | Freq: Four times a day (QID) | ORAL | 0 refills | Status: DC | PRN

## 2022-07-20 NOTE — Progress Notes (Addendum)
Brief ID Note  Called by RN that patient trying to discharge today.    Seen by Dr Gale Journey from ID this admission.  Dr Kerin Perna had prescribed Tramadol 50mg  q6h PRN x 20 tabs but printed the prescription that needs to be signed.  RN has attempted to page him x 3 times with no response yet and patient otherwise ready to go.    I was asked to prescribe the prescription electronically to her pharmacy which I initially did so that she may go home.  However, upon looking further at her meds, she has not required anything other than ibuprofen or tylenol for 48+ hrs.    I called RN back and advised to just have patient do ibuprofen or tylenol at home and to contact Dr Charlett Lango office for any further pain control needs after discharge.  I will call her [facility] and ask not to fill her Tramadol.   April Holland for Infectious Disease [facility] 07/20/2022, 1:46 PM

## 2022-07-20 NOTE — Progress Notes (Signed)
Discharge instructions given to patient: tylenol prn pain not to exceed 4 gram in 24 hours, Ibuprofen 600 mg every 6 hours. Patient informed to call her MD office if pain worsens and Tylenol/Motrin no longer helping, fever over 100.4, persistent N/V. Partial Augmentin RX given to patient from [facility]. Patient verbalizes understanding of her scheduled f/u with ID and that she needs to call Dr. Charlett Lango office for an appointment in 1-2 weeks. Patient discharged via [facility].

## 2022-07-20 NOTE — Progress Notes (Signed)
5364- Informed Dr. Karen Kitchens that discharge reconciliation needed to print AVS. 1030- Left message that Tramadol Rx was printed and needed to be signed or electronically sent to Rx. 1130- Another message left. 1230- Third message left. 1315- Discussed with pharmacy regarding medication and was told to ask the ID on call person. 1330- Dr. Jule Ser (ID) called to ask if he could order the Tramadol so the patient could be discharged home. MD reviewed the patients chart and since she has not been taking that medication that she could continue to take her Motrin and Tylenol prn pain. Call the office if her pain worsened.

## 2022-07-20 NOTE — Plan of Care (Signed)
Discharged with no complaints.

## 2022-07-23 LAB — CULTURE, BLOOD (ROUTINE X 2)
Culture: NO GROWTH
Culture: NO GROWTH
Special Requests: ADEQUATE
Special Requests: ADEQUATE

## 2022-07-24 NOTE — Discharge Summary (Signed)
Physician Discharge Summary  Patient ID: April Holland MRN: [id number] DOB/AGE: October 10, 1986 36 y.o.  Admit date: 07/15/2022 Discharge date: 07/24/2022  Admission Diagnoses:  Discharge Diagnoses:  Principal Problem:   Tubo-ovarian abscess Active Problems:   Abdominal pain with fever after surgery   Discharged Condition: good  Hospital Course: Patient was admitted for IV antibiotics Rocephin Flagyl and p.o. doxycycline.  Her temperature elevations and pain in the abdomen persisted.  After infectious disease consultation antibiotics were changed to Rocephin Flagyl and vancomycin.  Interventional radiology consultation concluded that the left tubo-ovarian complex was not accessible transcutaneously.  Patient's symptoms gradually resolved.  She no longer needed narcotic analgesics.  Consults: ID  Significant Diagnostic Studies: labs: CBC with differential showing mild leukocytosis and anemia, microbiology: blood culture: negative, and radiology: CT scan: Abdomen and pelvis  Treatments: IV hydration and antibiotics: ceftriaxone, metronidazole, and vancomycin  Discharge Exam: Blood pressure (!) 140/69, pulse 90, temperature 98.4 F (36.9 C), temperature source Oral, resp. rate 20, height 5\' 8"  (1.727 m), weight 105.2 kg, SpO2 96 %. Head: Normocephalic, without obvious abnormality, atraumatic Abdomen: Soft, slightly distended, bowel sounds present, diffuse mild tenderness without rebound tenderness Extremities: No clubbing cyanosis or edema  Disposition: Discharge disposition: 01-Home or Self Care      Discharge Instructions     Call MD for:  persistant nausea and vomiting   Complete by: As directed    Call MD for:  severe uncontrolled pain   Complete by: As directed    Call MD for:  temperature >100.4   Complete by: As directed    Diet general   Complete by: As directed    Increase activity slowly   Complete by: As directed       Allergies as of 07/20/2022       Reactions    Elemental Sulfur Rash   Sulfa Antibiotics Other (See Comments)   Sulfamethoxazole Rash        Medication List     STOP taking these medications    tranexamic acid 650 MG Tabs tablet Commonly known as: LYSTEDA       TAKE these medications    amoxicillin-clavulanate 875-125 MG tablet Commonly known as: AUGMENTIN Take 1 tablet by mouth 2 (two) times daily. Please call to get the refill to complete 42 day therapy from [facility]   aspirin EC 81 MG tablet Take 81 mg by mouth daily. Swallow whole.   Boric Acid Vaginal 600 MG Supp 600 mg   cholecalciferol 25 MCG (1000 UNIT) tablet Commonly known as: VITAMIN D3 Take 1,000 Units by mouth daily.   ferrous sulfate 325 (65 FE) MG tablet Take 1 tablet (325 mg total) by mouth every other day.   ibuprofen 800 MG tablet Commonly known as: ADVIL Take 1 tablet (800 mg total) by mouth every 8 (eight) hours as needed.   labetalol 100 MG tablet Commonly known as: NORMODYNE 2 tablets in the morning and 1 tablet at night   letrozole 2.5 MG tablet Commonly known as: FEMARA Take 2.5 mg by mouth daily.   levothyroxine 25 MCG tablet Commonly known as: SYNTHROID Take 1 tablet by mouth daily at 12 noon.   metFORMIN 500 MG tablet Commonly known as: GLUCOPHAGE Take 500 mg by mouth 2 (two) times daily.   multivitamin with minerals Tabs tablet Take 1 tablet by mouth daily.   multivitamin-prenatal 27-0.8 MG Tabs tablet Take 1 tablet by mouth daily at 12 noon.   norethindrone 5 MG tablet Commonly known as: AYGESTIN  Take 2.5 mg by mouth in the morning and at bedtime.         Signed: Governor Specking 07/24/2022, 1:44 PM

## 2022-07-24 NOTE — H&P (Signed)
April Holland is a 36 y.o. female patient. 1. Pelvic pain         Past Medical History:  Diagnosis Date   Essential hypertension 09/17/2021   Hypertension     Prediabetes               Current Facility-Administered Medications  Medication Dose Route Frequency Provider Last Rate Last Admin   acetaminophen (TYLENOL) tablet 650 mg  650 mg Oral Q6H PRN Fermin Schwab, MD   650 mg at 07/16/22 0001   cefTRIAXone (ROCEPHIN) 2 g in sodium chloride 0.9 % 100 mL IVPB  2 g Intravenous Q24H Margarita Grizzle, MD 200 mL/hr at 07/16/22 1401 2 g at 07/16/22 1401   diphenhydrAMINE (BENADRYL) injection 12.5 mg  12.5 mg Intravenous Q6H PRN Fermin Schwab, MD        Or   diphenhydrAMINE (BENADRYL) 12.5 MG/5ML elixir 12.5 mg  12.5 mg Oral Q6H PRN Fermin Schwab, MD       doxycycline (VIBRA-TABS) tablet 100 mg  100 mg Oral Q12H Fermin Schwab, MD   100 mg at 07/16/22 0940   enoxaparin (LOVENOX) injection 40 mg  40 mg Subcutaneous Q24H Fermin Schwab, MD       HYDROmorphone (DILAUDID) 1 mg/mL PCA injection   Intravenous Q4H Fermin Schwab, MD       ibuprofen (ADVIL) tablet 600 mg  600 mg Oral Q6H PRN Fermin Schwab, MD   600 mg at 07/16/22 0940   lactated ringers infusion   Intravenous Continuous Margarita Grizzle, MD 100 mL/hr at 07/15/22 1836 New Bag at 07/15/22 1836   metroNIDAZOLE (FLAGYL) IVPB 500 mg  500 mg Intravenous Q12H Margarita Grizzle, MD 100 mL/hr at 07/16/22 1233 500 mg at 07/16/22 1233   naloxone (NARCAN) injection 0.4 mg  0.4 mg Intravenous PRN Fermin Schwab, MD        And   sodium chloride flush (NS) 0.9 % injection 9 mL  9 mL Intravenous PRN Fermin Schwab, MD       ondansetron [facility]) injection 4 mg  4 mg Intravenous Q6H PRN Fermin Schwab, MD       ondansetron [facility]) tablet 4 mg  4 mg Oral Q8H PRN Fermin Schwab, MD       oxyCODONE-acetaminophen (PERCOCET/ROXICET) 5-325 MG per tablet 1-2 tablet  1-2 tablet Oral Q6H PRN Fermin Schwab, MD   2 tablet at 07/16/22  0940        Allergies  Allergen Reactions   Elemental Sulfur Rash   Sulfa Antibiotics Other (See Comments)   Sulfamethoxazole Rash    Principal Problem:   Tubo-ovarian abscess   Blood pressure 139/72, pulse (!) 117, temperature 100 F (37.8 C), temperature source Oral, resp. rate 18, height 5\' 8"  (1.727 m), weight 105.2 kg, SpO2 98 %.   Subjective  Patient continues to complain of generalized abdominal pain.  Passing flatus.  No vomiting.   Objective: Vital signs: (most recent): Blood pressure 139/72, pulse (!) 117, temperature 100 F (37.8 C), temperature source Oral, resp. rate 18, height 5\' 8"  (1.727 m), weight 105.2 kg, SpO2 98 %.     Assessment & Plan   Low-grade temperature with leukocytosis and generalized abdominal pain continues with suspected tubo-ovarian abscess on IV antibiotics x24 hours We will consult ID for appropriateness of antibiotic choice We will consult IR for possible drainage of the 5 x 4 cm complex posterior pelvic fluid collection/tubo-ovarian complex Continue IV antibiotics We will switch from Percocet/7.5/325 to PCA Dilaudid  [name] 07/16/2022

## 2022-08-07 ENCOUNTER — Ambulatory Visit (INDEPENDENT_AMBULATORY_CARE_PROVIDER_SITE_OTHER): Payer: No Typology Code available for payment source | Admitting: Internal Medicine

## 2022-08-07 ENCOUNTER — Other Ambulatory Visit: Payer: Self-pay

## 2022-08-07 ENCOUNTER — Encounter: Payer: Self-pay | Admitting: Internal Medicine

## 2022-08-07 VITALS — BP 125/85 | HR 92 | Temp 98.6°F | Ht 68.0 in | Wt 213.0 lb

## 2022-08-07 DIAGNOSIS — N7093 Salpingitis and oophoritis, unspecified: Secondary | ICD-10-CM

## 2022-08-07 NOTE — Patient Instructions (Signed)
Follow up with me in 6-7 weeks from now  Finish the augmentin in 3 more weeks   Discuss with your obgyn about the right upper quadrant pain/lower chest pain. You are on aygestin and I wonder if there is some risk for blood clot. If recurrent pain would advise imaging, unless your obgyn wants to do one anyway   For constipation can try dulcolax suppository and milk of magnesia. If these don't work might need mineral oil enema

## 2022-08-07 NOTE — Progress Notes (Signed)
[facility]  Patient Active Problem List   Diagnosis Date Noted   Abdominal pain with fever after surgery 07/16/2022   Tubo-ovarian abscess 07/15/2022   Positive ANA (antinuclear antibody) 02/28/2022   Menstrual disorder 02/27/2022   Family history of other endocrine, nutritional and metabolic diseases 02/27/2022   Menorrhagia 02/27/2022   Female infertility 02/27/2022   Prediabetes 02/27/2022   Vitamin D deficiency 02/27/2022   Essential hypertension 09/17/2021   Goiter 01/11/2013      Subjective:    Patient ID: April Holland, female    DOB: 19-Mar-1986, 36 y.o.   MRN: [id number]  Chief Complaint  Patient presents with   Hospitalization Follow-up    HPI:  April Holland is a 36 y.o. female endometriosis here for hospital f/u TOA post ovarian sampling for preparation of fertility planning  She had a 5 cm abscess on ct scan Sepsis on presentation; negative bcx Negative std screen No sampling possible Started on iv bsAbx transitioned to amox-clav planned 6 weeks on discharge on 07/20/22  Reports ruq pain but no f/c and not present at this time. One episode about 1 week ago. No hx prior biliary stone. Wasn't triggered after a meal but after she woke up from nap. It hurts more when she takes a deep breath  No cough/short of breath No n/v/diarrhea/rash  Her stamina is improving but not back yet  She is still on hormone. Non-smoker  Asked about management of constipation. Taking advil not opioid  She has about 3 more weeks to go with antibiotics      Allergies  Allergen Reactions   Elemental Sulfur Rash   Sulfa Antibiotics Other (See Comments)   Sulfamethoxazole Rash      Outpatient Medications Prior to Visit  Medication Sig Dispense Refill   amoxicillin-clavulanate (AUGMENTIN) 875-125 MG tablet Take 1 tablet by mouth 2 (two) times daily. Please call to get the refill to complete 42 day therapy from [facility] 84 tablet 0    aspirin EC 81 MG tablet Take 81 mg by mouth daily. Swallow whole.     Boric Acid Vaginal 600 MG SUPP 600 mg     cholecalciferol (VITAMIN D3) 25 MCG (1000 UNIT) tablet Take 1,000 Units by mouth daily.     ferrous sulfate 325 (65 FE) MG tablet Take 1 tablet (325 mg total) by mouth every other day. 30 tablet 3   Ibuprofen-Acetaminophen (ADVIL DUAL ACTION) 125-250 MG TABS      letrozole (FEMARA) 2.5 MG tablet Take 2.5 mg by mouth daily.     norethindrone (AYGESTIN) 5 MG tablet Take 2.5 mg by mouth in the morning and at bedtime.     Prenatal Vit-Fe Fumarate-FA (MULTIVITAMIN-PRENATAL) 27-0.8 MG TABS tablet Take 1 tablet by mouth daily at 12 noon.     ibuprofen (ADVIL) 800 MG tablet Take 1 tablet (800 mg total) by mouth every 8 (eight) hours as needed. 30 tablet 0   labetalol (NORMODYNE) 100 MG tablet 2 tablets in the morning and 1 tablet at night (Patient not taking: Reported on 02/28/2022)     levothyroxine (SYNTHROID) 25 MCG tablet Take 1 tablet by mouth daily at 12 noon.     metFORMIN (GLUCOPHAGE) 500 MG tablet Take 500 mg by mouth 2 (two) times daily.     Multiple Vitamin (MULTIVITAMIN WITH MINERALS) TABS tablet Take 1 tablet by mouth daily. (Patient not taking: Reported on 08/07/2022)     No facility-administered medications prior  to visit.     Social History   Socioeconomic History   Marital status: Married    Spouse name: Not on file   Number of children: Not on file   Years of education: Not on file   Highest education level: Not on file  Occupational History   Not on file  Tobacco Use   Smoking status: Never   Smokeless tobacco: Never  Vaping Use   Vaping Use: Never used  Substance and Sexual Activity   Alcohol use: Not Currently    Comment: social   Drug use: No   Sexual activity: Yes  Other Topics Concern   Not on file  Social History Narrative   Not on file   Social Determinants of Health   Financial Resource Strain: Low Risk  (09/17/2021)   Overall Financial  Resource Strain (CARDIA)    Difficulty of Paying Living Expenses: Not very hard  Food Insecurity: No Food Insecurity (07/15/2022)   Hunger Vital Sign    Worried About Running Out of Food in the Last Year: Never true    Ran Out of Food in the Last Year: Never true  Transportation Needs: No Transportation Needs (07/15/2022)   PRAPARE - Hydrologist (Medical): No    Lack of Transportation (Non-Medical): No  Physical Activity: Inactive (09/17/2021)   Exercise Vital Sign    Days of Exercise per Week: 0 days    Minutes of Exercise per Session: 0 min  Stress: Not on file  Social Connections: Not on file  Intimate Partner Violence: Not At Risk (07/15/2022)   Humiliation, Afraid, Rape, and Kick questionnaire    Fear of Current or Ex-Partner: No    Emotionally Abused: No    Physically Abused: No    Sexually Abused: No      Review of Systems    All other ros negative   Objective:    BP 125/85   Pulse 92   Temp 98.6 F (37 C) (Oral)   Ht 5\' 8"  (1.727 m)   Wt 213 lb (96.6 kg)   SpO2 100%   BMI 32.39 kg/m  Nursing note and vital signs reviewed.  Physical Exam     General/constitutional: no distress, pleasant HEENT: Normocephalic, PER, Conj Clear, EOMI, Oropharynx clear Neck supple CV: rrr no mrg Lungs: clear to auscultation, normal respiratory effort Abd: Soft, Nontender Ext: no edema Skin: No Rash Neuro: nonfocal MSK: no peripheral joint swelling/tenderness/warmth; back spines nontender    Labs: Lab Results  Component Value Date   WBC 9.9 07/20/2022   HGB 8.3 (L) 07/20/2022   HCT 25.0 (L) 07/20/2022   MCV 83.3 07/20/2022   PLT 395 59/56/3875   Last metabolic panel Lab Results  Component Value Date   GLUCOSE 119 (H) 07/17/2022   NA 136 07/17/2022   K 3.6 07/17/2022   CL 101 07/17/2022   CO2 29 07/17/2022   BUN 6 07/17/2022   CREATININE 1.04 (H) 07/17/2022   GFRNONAA >60 07/17/2022   CALCIUM 8.0 (L) 07/17/2022   PROT 7.5  07/15/2022   ALBUMIN 3.3 (L) 07/15/2022   BILITOT 1.5 (H) 07/15/2022   ALKPHOS 59 07/15/2022   AST 49 (H) 07/15/2022   ALT 84 (H) 07/15/2022   ANIONGAP 6 07/17/2022    Micro:  Serology:  Imaging: 9/25 ct abd pelv Findings are most consistent with pelvic inflammatory disease with tubo-ovarian abscess along the left posterior uterus, measuring up to 5.8 x 5.1 x 5.1 cm. Extensive stranding  in the pelvis and small volume abdominopelvic ascites. A component of postprocedural change in the pelvis is possible, however, this would be a diagnosis of exclusion.   Assessment & Plan:   Problem List Items Addressed This Visit   None Visit Diagnoses     TOA (tubo-ovarian abscess)    -  Primary   Relevant Orders   C-reactive protein   CBC   COMPLETE METABOLIC PANEL WITH GFR         Abx: 9/28-9/30 vanc/flagyl/ceftriaxone  9/30-c amox-clav   ASSESSMENT: TOA post ovarian sampling; 5 cm abscess on ct scan Sepsis resolved Endometriosis    Sepsis resolved as of 9/28. Clinically doing better 9/28 bcx while on abx ngtd If continued improvement by tomorrow, given negative std testing (and low risk), can discharge on amox-clav Planning 6 weeks abx on hand and will see in rcid clinic to determine further treatment course/evaluation   -------- 08/07/22 assessment Doing well Discussed 2 ways of going about the small abscess. 6 weeks and then monitor off abx vs repeat ct and see (which ct can lag behind tx success in terms of sterility of abscess and body self resorbing it)  Agree to do treatment and then observe off abx  -finish 3 more weeks abx -f/u 6-7 weeks -labs today  -advise to discuss with obgyn to see if any concern about dvt on aygestin given prior episode of ruq pain with some pleuritic quality (resolved now)        Follow-up: Return in about 6 weeks (around 09/18/2022).      Raymondo Band, MD [facility] 08/07/2022, 10:03 AM

## 2022-08-08 LAB — CBC
HCT: 28.1 % — ABNORMAL LOW (ref 35.0–45.0)
Hemoglobin: 8.7 g/dL — ABNORMAL LOW (ref 11.7–15.5)
MCH: 24.9 pg — ABNORMAL LOW (ref 27.0–33.0)
MCHC: 31 g/dL — ABNORMAL LOW (ref 32.0–36.0)
MCV: 80.5 fL (ref 80.0–100.0)
MPV: 11 fL (ref 7.5–12.5)
Platelets: 518 10*3/uL — ABNORMAL HIGH (ref 140–400)
RBC: 3.49 10*6/uL — ABNORMAL LOW (ref 3.80–5.10)
RDW: 15.8 % — ABNORMAL HIGH (ref 11.0–15.0)
WBC: 13.1 10*3/uL — ABNORMAL HIGH (ref 3.8–10.8)

## 2022-08-08 LAB — COMPLETE METABOLIC PANEL WITH GFR
AG Ratio: 0.8 (calc) — ABNORMAL LOW (ref 1.0–2.5)
ALT: 83 U/L — ABNORMAL HIGH (ref 6–29)
AST: 47 U/L — ABNORMAL HIGH (ref 10–30)
Albumin: 3.6 g/dL (ref 3.6–5.1)
Alkaline phosphatase (APISO): 247 U/L — ABNORMAL HIGH (ref 31–125)
BUN/Creatinine Ratio: 10 (calc) (ref 6–22)
BUN: 11 mg/dL (ref 7–25)
CO2: 26 mmol/L (ref 20–32)
Calcium: 9.2 mg/dL (ref 8.6–10.2)
Chloride: 104 mmol/L (ref 98–110)
Creat: 1.11 mg/dL — ABNORMAL HIGH (ref 0.50–0.97)
Globulin: 4.8 g/dL (calc) — ABNORMAL HIGH (ref 1.9–3.7)
Glucose, Bld: 100 mg/dL — ABNORMAL HIGH (ref 65–99)
Potassium: 4 mmol/L (ref 3.5–5.3)
Sodium: 140 mmol/L (ref 135–146)
Total Bilirubin: 0.5 mg/dL (ref 0.2–1.2)
Total Protein: 8.4 g/dL — ABNORMAL HIGH (ref 6.1–8.1)
eGFR: 65 mL/min/{1.73_m2} (ref >=60)

## 2022-08-08 LAB — C-REACTIVE PROTEIN: CRP: 261.5 mg/L — ABNORMAL HIGH (ref <8.0)

## 2022-08-09 ENCOUNTER — Other Ambulatory Visit: Payer: Self-pay | Admitting: Internal Medicine

## 2022-08-09 ENCOUNTER — Ambulatory Visit
Admission: RE | Admit: 2022-08-09 | Discharge: 2022-08-09 | Disposition: A | Discharge status: Home / Self Care | Payer: No Typology Code available for payment source | Source: Ambulatory Visit | Attending: Internal Medicine | Admitting: Internal Medicine

## 2022-08-09 DIAGNOSIS — R7989 Other specified abnormal findings of blood chemistry: Secondary | ICD-10-CM

## 2022-08-09 DIAGNOSIS — R1011 Right upper quadrant pain: Secondary | ICD-10-CM

## 2022-08-09 MED ORDER — IOPAMIDOL (ISOVUE-370) INJECTION 76%
75.0000 mL | Freq: Once | INTRAVENOUS | Status: AC | PRN
  Administered 2022-08-09: 75 mL via INTRAVENOUS

## 2022-08-13 ENCOUNTER — Other Ambulatory Visit: Payer: Self-pay | Admitting: Internal Medicine

## 2022-08-13 ENCOUNTER — Ambulatory Visit
Admission: RE | Admit: 2022-08-13 | Discharge: 2022-08-13 | Disposition: A | Discharge status: Home / Self Care | Payer: No Typology Code available for payment source | Source: Ambulatory Visit | Attending: Internal Medicine | Admitting: Internal Medicine

## 2022-08-13 DIAGNOSIS — R1011 Right upper quadrant pain: Secondary | ICD-10-CM

## 2022-08-14 ENCOUNTER — Telehealth: Payer: Self-pay

## 2022-08-14 MED ORDER — ONDANSETRON HCL 8 MG PO TABS: 8.0000 mg | ORAL_TABLET | Freq: Three times a day (TID) | ORAL | 1 refills | Status: DC | PRN

## 2022-08-14 NOTE — Telephone Encounter (Signed)
Spoke with patient, advised she try over the counter imodium per package instructions and increase fluid intake. She mentions that she is also experiencing nausea and vomiting as well.   Beryle Flock, RN

## 2022-08-14 NOTE — Telephone Encounter (Signed)
Patient called with concerns in regards to side effect of the Augmentin. Has been on Augmentin for three weeks and has had diarrhea since beginning the medication. Reports abdominal cramping and 5-6 episodes of diarrhea a day. Has not tried any over the counter medications for relief.   She also states that her PCP has been monitoring her labs and that LFTs have been steadily increasing.   Spoke with PCP [facility] office and requested that recent labs be faxed to triage.   Beryle Flock, RN

## 2022-08-14 NOTE — Addendum Note (Signed)
Addended byPrudencio Pair T on: 08/14/2022 12:07 PM   Modules accepted: Orders

## 2022-08-19 ENCOUNTER — Other Ambulatory Visit (HOSPITAL_COMMUNITY): Payer: Self-pay

## 2022-08-23 NOTE — Progress Notes (Signed)
Office Visit Note  Patient: April Holland             Date of Birth: [birth year]           MRN: [id number]             PCP: Collene Mares, PA Referring: Collene Mares, [place] Visit Date: 09/06/2022   Subjective:   History of Present Illness: April Holland is a 36 y.o. female here for follow up with arthralgias with abnormal labs with positive dsDNA and SSB Abs. Symptoms have been about the same over time, still with joint pain sometimes and morning stiffness of a few minutes. Feels stiff in the low back and legs often and knee pain provoked with climbing stairs. She had a recent hospitalization last month with tuboovarian abscess and just finished oral antibiotics course after discharge.   Previous HPI 02/28/2022 April Holland is a 36 y.o. female here for evaluation of positive ANA and elevated sedimentation rate checked in association with abnormal liver function tests.  She is also had increased joint pain and stiffness in multiple areas.  She has sustained previous use related injury into the right foot and ankle and feels this has contributed to some left hip and knee pain when she is avoiding equal distribution of her weight.  She previously played basketball had some minor injuries from this more recently dance and weightlifting for physical activities. She never required any significant surgeries major joint fractures or dislocations. She does feel she has had a significant weight gain and decrease in her overall exercise and conditioning associated with COVID.  She does not notice any significant amount of visible or palpable joint swelling erythema or warmth.  Not having any new skin rashes, oral ulcers, lymphadenopathy, Raynaud's symptoms.  She has no history of blood clots or abnormal bleeding.  Does have history of goiter also labs reviewed showing normal thyroid function studies but moderately positive thyroperoxidase antibody tests.     Activities of Daily Living:  Patient  reports morning stiffness for 20 minutes.   Patient Denies nocturnal pain.  Difficulty dressing/grooming: Denies Difficulty climbing stairs: Denies Difficulty getting out of chair: Denies Difficulty using hands for taps, buttons, cutlery, and/or writing: Denies   Review of Systems  Constitutional:  Negative for fatigue.  HENT:  Negative for mouth sores and mouth dryness.   Eyes:  Negative for dryness.  Respiratory:  Negative for shortness of breath.   Cardiovascular:  Negative for chest pain and palpitations.  Gastrointestinal:  Negative for blood in stool, constipation and diarrhea.  Endocrine: Negative for increased urination.  Genitourinary:  Negative for involuntary urination.  Musculoskeletal:  Positive for joint pain, joint pain, myalgias, morning stiffness and myalgias. Negative for gait problem, joint swelling, muscle weakness and muscle tenderness.  Skin:  Negative for color change, rash, hair loss and sensitivity to sunlight.  Allergic/Immunologic: Negative for susceptible to infections.  Neurological:  Negative for dizziness and headaches.  Hematological:  Negative for swollen glands.  Psychiatric/Behavioral:  Negative for depressed mood and sleep disturbance. The patient is not nervous/anxious.     PMFS History:  Patient Active Problem List   Diagnosis Date Noted  . Patellofemoral pain syndrome 09/06/2022  . Abdominal pain with fever after surgery 07/16/2022  . Tubo-ovarian abscess 07/15/2022  . Positive ANA (antinuclear antibody) 02/28/2022  . Menstrual disorder 02/27/2022  . Family history of other endocrine, nutritional and metabolic diseases 02/27/2022  . Menorrhagia 02/27/2022  . Female infertility 02/27/2022  .  Prediabetes 02/27/2022  . Vitamin D deficiency 02/27/2022  . Essential hypertension 09/17/2021  . Goiter 01/11/2013    Past Medical History:  Diagnosis Date  . Essential hypertension 09/17/2021  . Hypertension   . Prediabetes   . Rupture of  ovarian cyst     Family History  Problem Relation Age of Onset  . Thyroid disease Mother   . Crohn's disease Mother   . High Cholesterol Father   . Hypertension Father   . Stroke Maternal Grandmother   . Heart failure Maternal Grandfather   . Breast cancer Maternal Grandfather        in 80's  . Diabetes Maternal Grandfather   . Stroke Paternal Grandmother    Past Surgical History:  Procedure Laterality Date  . DILATATION & CURETTAGE/HYSTEROSCOPY WITH MYOSURE N/A 08/09/2020   Procedure: DILATATION & CURETTAGE/HYSTEROSCOPY WITH MYOSURE;  Surgeon: Christophe Louis, MD;  Location: [place];  Service: Gynecology;  Laterality: N/A;  . HYSTEROSCOPY  2022  . WISDOM TOOTH EXTRACTION     Social History   Social History Narrative  . Not on file    There is no immunization history on file for this patient.   Objective: Vital Signs: BP (!) 141/85 (BP Location: Right Arm, Patient Position: Sitting, Cuff Size: Large)   Pulse 84   Resp 12   Ht 5\' 8"  (1.727 m)   Wt 211 lb (95.7 kg)   BMI 32.08 kg/m    Physical Exam Constitutional:      Appearance: She is obese.  Eyes:     Conjunctiva/sclera: Conjunctivae normal.  Cardiovascular:     Rate and Rhythm: Normal rate and regular rhythm.  Pulmonary:     Effort: Pulmonary effort is normal.     Breath sounds: Normal breath sounds.  Musculoskeletal:     Right lower leg: No edema.     Left lower leg: No edema.  Skin:    General: Skin is warm and dry.     Findings: No rash.  Neurological:     Mental Status: She is alert.  Psychiatric:        Mood and Affect: Mood normal.     Musculoskeletal Exam:  Shoulders full ROM no tenderness or swelling Elbows full ROM no tenderness or swelling Wrists full ROM no tenderness or swelling Fingers full ROM no tenderness or swelling Knees full ROM no tenderness or swelling Ankles full ROM no tenderness or swelling MTPs full ROM no tenderness or swelling   Investigation: No  additional findings.  Imaging: US Abdomen Limited RUQ (LIVER/GB)  Result Date: 08/13/2022 CLINICAL DATA:  Provided history: Right upper quadrant pain. Additional history provided by scanning technologist: Patient reports reports symptoms for 1 month. EXAM: ULTRASOUND ABDOMEN LIMITED RIGHT UPPER QUADRANT COMPARISON:  CT abdomen/pelvis 07/15/2022. Abdominal ultrasound 06/01/2021. FINDINGS: Gallbladder: No gallstones or wall thickening visualized. No sonographic Murphy sign noted by sonographer. Common bile duct: Diameter: 3-4 mm, within normal limits. Liver: No focal lesion identified. Increased hepatic parenchymal echogenicity. Portal vein is patent on color Doppler imaging with normal direction of blood flow towards the liver. IMPRESSION: Hyperechogenicity of the hepatic parenchyma. This is a nonspecific finding, which may be seen in the setting of hepatic steatosis or other chronic hepatic parenchymal disease. Otherwise unremarkable right upper quadrant ultrasound, as described. Electronically Signed   By: Kellie Simmering D.O.   On: 08/13/2022 14:15   CT Angio Chest Pulmonary Embolism (PE) W or WO Contrast  Result Date: 08/09/2022 CLINICAL DATA:  Chest pain.  EXAM: CT ANGIOGRAPHY CHEST WITH CONTRAST TECHNIQUE: Multidetector CT imaging of the chest was performed using the standard protocol during bolus administration of intravenous contrast. Multiplanar CT image reconstructions and MIPs were obtained to evaluate the vascular anatomy. RADIATION DOSE REDUCTION: This exam was performed according to the departmental dose-optimization program which includes automated exposure control, adjustment of the mA and/or kV according to patient size and/or use of iterative reconstruction technique. CONTRAST:  49mL ISOVUE-370 IOPAMIDOL (ISOVUE-370) INJECTION 76% COMPARISON:  None Available. FINDINGS: Cardiovascular: Satisfactory opacification of the pulmonary arteries to the segmental level. No evidence of pulmonary  embolism. Normal heart size. No pericardial effusion. Mediastinum/Nodes: No enlarged mediastinal, hilar, or axillary lymph nodes. Thyroid gland, trachea, and esophagus demonstrate no significant findings. Lungs/Pleura: No pneumothorax or pleural effusion is noted. Mild bibasilar subsegmental atelectasis is noted. Upper Abdomen: No acute abnormality. Musculoskeletal: No chest wall abnormality. No acute or significant osseous findings. Review of the MIP images confirms the above findings. IMPRESSION: No definite evidence of pulmonary embolus. Mild bibasilar subsegmental atelectasis. Electronically Signed   By: Lupita Raider M.D.   On: 08/09/2022 16:31   DG Chest 2 View  Result Date: 08/09/2022 CLINICAL DATA:  Right upper quadrant pain EXAM: CHEST - 2 VIEW COMPARISON:  Prior chest radiographs, correlation is made with CT abdomen pelvis 07/15/2022 FINDINGS: Elevation of the right hemidiaphragm, similar to the prior CT abdomen pelvis. Cardiac and mediastinal contours are within normal limits. No focal pulmonary opacity. No pleural effusion or pneumothorax. No acute osseous abnormality. IMPRESSION: No acute cardiopulmonary process. Electronically Signed   By: Wiliam Ke M.D.   On: 08/09/2022 11:39    Recent Labs: Lab Results  Component Value Date   WBC 13.1 (H) 08/07/2022   HGB 8.7 (L) 08/07/2022   PLT 518 (H) 08/07/2022   NA 140 08/07/2022   K 4.0 08/07/2022   CL 104 08/07/2022   CO2 26 08/07/2022   GLUCOSE 100 (H) 08/07/2022   BUN 11 08/07/2022   CREATININE 1.11 (H) 08/07/2022   BILITOT 0.5 08/07/2022   ALKPHOS 59 07/15/2022   AST 47 (H) 08/07/2022   ALT 83 (H) 08/07/2022   PROT 8.4 (H) 08/07/2022   ALBUMIN 3.3 (L) 07/15/2022   CALCIUM 9.2 08/07/2022    Speciality Comments: No specialty comments available.  Procedures:  No procedures performed Allergies: Elemental sulfur, Sulfa antibiotics, and Sulfamethoxazole   Assessment / Plan:     Visit Diagnoses: Positive ANA (antinuclear  antibody) - Plan: Anti-DNA antibody, double-stranded, C3 and C4, Sjogrens syndrome-B extractable nuclear antibody  Goiter Family history of other endocrine, nutritional and metabolic diseases  Patellofemoral pain syndrome of both knees  ***  Orders: Orders Placed This Encounter  Procedures  . Anti-DNA antibody, double-stranded  . C3 and C4  . Sjogrens syndrome-B extractable nuclear antibody   No orders of the defined types were placed in this encounter.    Follow-Up Instructions: No follow-ups on file.   Fuller Plan, MD  Note - This record has been created using [employer].  Chart creation errors have been sought, but may not always  have been located. Such creation errors do not reflect on  the standard of medical care.

## 2022-09-06 ENCOUNTER — Encounter: Payer: Self-pay | Admitting: Internal Medicine

## 2022-09-06 ENCOUNTER — Ambulatory Visit: Payer: No Typology Code available for payment source | Attending: Internal Medicine | Admitting: Internal Medicine

## 2022-09-06 VITALS — BP 141/85 | HR 84 | Resp 12 | Ht 68.0 in | Wt 211.0 lb

## 2022-09-06 DIAGNOSIS — E049 Nontoxic goiter, unspecified: Secondary | ICD-10-CM | POA: Diagnosis not present

## 2022-09-06 DIAGNOSIS — R768 Other specified abnormal immunological findings in serum: Secondary | ICD-10-CM

## 2022-09-06 DIAGNOSIS — M222X1 Patellofemoral disorders, right knee: Secondary | ICD-10-CM

## 2022-09-06 DIAGNOSIS — M222X9 Patellofemoral disorders, unspecified knee: Secondary | ICD-10-CM | POA: Insufficient documentation

## 2022-09-06 DIAGNOSIS — M222X2 Patellofemoral disorders, left knee: Secondary | ICD-10-CM

## 2022-09-06 DIAGNOSIS — Z8349 Family history of other endocrine, nutritional and metabolic diseases: Secondary | ICD-10-CM | POA: Diagnosis not present

## 2022-09-07 LAB — C3 AND C4
C3 Complement: 184 mg/dL (ref 83–193)
C4 Complement: 22 mg/dL (ref 15–57)

## 2022-09-07 LAB — ANTI-DNA ANTIBODY, DOUBLE-STRANDED: ds DNA Ab: 4 IU/mL

## 2022-09-07 LAB — SJOGRENS SYNDROME-B EXTRACTABLE NUCLEAR ANTIBODY: SSB (La) (ENA) Antibody, IgG: <1 AI

## 2022-09-07 NOTE — Progress Notes (Signed)
Lab results are now all negative for the previous low positive antibody tests.  This is really reassuring and I have no ongoing concern for lupus and do not need scheduled follow-up.

## 2022-09-24 ENCOUNTER — Encounter: Payer: Self-pay | Admitting: Internal Medicine

## 2022-09-24 ENCOUNTER — Ambulatory Visit (INDEPENDENT_AMBULATORY_CARE_PROVIDER_SITE_OTHER): Payer: No Typology Code available for payment source | Admitting: Internal Medicine

## 2022-09-24 ENCOUNTER — Other Ambulatory Visit: Payer: Self-pay

## 2022-09-24 VITALS — BP 134/86 | HR 92 | Temp 98.2°F | Resp 16 | Ht 68.0 in | Wt 203.0 lb

## 2022-09-24 DIAGNOSIS — N7093 Salpingitis and oophoritis, unspecified: Secondary | ICD-10-CM

## 2022-09-24 NOTE — Patient Instructions (Signed)
Let's get lab and ct repeat today given the fever/recent left lower quadrant pain  Make a video visit with me a week after the ct is done  If persistent fever/pain let me know prior.  thanks

## 2022-09-24 NOTE — Progress Notes (Signed)
[facility]  Patient Active Problem List   Diagnosis Date Noted   Patellofemoral pain syndrome 09/06/2022   Abdominal pain with fever after surgery 07/16/2022   Tubo-ovarian abscess 07/15/2022   Positive ANA (antinuclear antibody) 02/28/2022   Menstrual disorder 02/27/2022   Family history of other endocrine, nutritional and metabolic diseases 02/27/2022   Menorrhagia 02/27/2022   Female infertility 02/27/2022   Prediabetes 02/27/2022   Vitamin D deficiency 02/27/2022   Essential hypertension 09/17/2021   Goiter 01/11/2013      Subjective:    Patient ID: April Holland, female    DOB: January 14, 1986, 36 y.o.   MRN: [id number]  Chief Complaint  Patient presents with   Follow-up    TOA (tubo-ovarian abscess) - patient reports she went out of town to [place] and did a lot of walking - started having pain in LLQ, was running a low grade fever about 1.5 weeks ago. Taking advil PRN     HPI:  April Holland is a 36 y.o. female endometriosis here for hospital f/u TOA post ovarian sampling for preparation of fertility planning  She had a 5 cm abscess on ct scan Sepsis on presentation; negative bcx Negative std screen No sampling possible Started on iv bsAbx transitioned to amox-clav planned 6 weeks on discharge on 07/20/22  Reports ruq pain but no f/c and not present at this time. One episode about 1 week ago. No hx prior biliary stone. Wasn't triggered after a meal but after she woke up from nap. It hurts more when she takes a deep breath  No cough/short of breath No n/v/diarrhea/rash  Her stamina is improving but not back yet  She is still on hormone. Non-smoker  Asked about management of constipation. Taking advil not opioid  She has about 3 more weeks to go with antibiotics   ------------- 09/24/22 id f/u She is done with abx for about 4 weeks Back on her hormone suppresion No f/c Some discomfort left lower quadrant but has a lot of  recent walking [place]. This has gotten better She feels a little bloated otherwise Appetite good Have had a fever the last week 101. Never had fever with hormone suppression therapy for endometriosis. Covid test was negative. No URI sx.   Allergies  Allergen Reactions   Elemental Sulfur Rash   Sulfa Antibiotics Other (See Comments)   Sulfamethoxazole Rash      Outpatient Medications Prior to Visit  Medication Sig Dispense Refill   aspirin EC 81 MG tablet Take 81 mg by mouth daily. Swallow whole.     Boric Acid Vaginal 600 MG SUPP 600 mg     Ibuprofen-Acetaminophen (ADVIL DUAL ACTION) 125-250 MG TABS      letrozole (FEMARA) 2.5 MG tablet Take 2.5 mg by mouth daily.     Multiple Vitamin (MULTIVITAMIN WITH MINERALS) TABS tablet Take 1 tablet by mouth daily.     norethindrone (AYGESTIN) 5 MG tablet Take 2.5 mg by mouth in the morning and at bedtime.     Prenatal Vit-Fe Fumarate-FA (MULTIVITAMIN-PRENATAL) 27-0.8 MG TABS tablet Take 1 tablet by mouth daily at 12 noon.     ferrous sulfate 325 (65 FE) MG tablet Take 1 tablet (325 mg total) by mouth every other day. 30 tablet 3   No facility-administered medications prior to visit.     Social History   Socioeconomic History   Marital status: Married  Spouse name: Not on file   Number of children: Not on file   Years of education: Not on file   Highest education level: Not on file  Occupational History   Not on file  Tobacco Use   Smoking status: Never    Passive exposure: Never   Smokeless tobacco: Never  Vaping Use   Vaping Use: Never used  Substance and Sexual Activity   Alcohol use: Not Currently    Comment: social   Drug use: No   Sexual activity: Yes  Other Topics Concern   Not on file  Social History Narrative   Not on file   Social Determinants of Health   Financial Resource Strain: Low Risk  (09/17/2021)   Overall Financial Resource Strain (CARDIA)    Difficulty of Paying  Living Expenses: Not very hard  Food Insecurity: No Food Insecurity (07/15/2022)   Hunger Vital Sign    Worried About Running Out of Food in the Last Year: Never true    Ran Out of Food in the Last Year: Never true  Transportation Needs: No Transportation Needs (07/15/2022)   PRAPARE - Administrator, Civil Service (Medical): No    Lack of Transportation (Non-Medical): No  Physical Activity: Inactive (09/17/2021)   Exercise Vital Sign    Days of Exercise per Week: 0 days    Minutes of Exercise per Session: 0 min  Stress: Not on file  Social Connections: Not on file  Intimate Partner Violence: Not At Risk (07/15/2022)   Humiliation, Afraid, Rape, and Kick questionnaire    Fear of Current or Ex-Partner: No    Emotionally Abused: No    Physically Abused: No    Sexually Abused: No      Review of Systems    All other ros negative   Objective:    BP 134/86   Pulse 92   Temp 98.2 F (36.8 C) (Oral)   Resp 16   Ht 5\' 8"  (1.727 m)   Wt 203 lb (92.1 kg)   SpO2 100%   BMI 30.87 kg/m  Nursing note and vital signs reviewed.  Physical Exam     General/constitutional: no distress, pleasant HEENT: Normocephalic, PER, Conj Clear, EOMI, Oropharynx clear Neck supple CV: rrr no mrg Lungs: clear to auscultation, normal respiratory effort Abd: Soft, Nontender Ext: no edema Skin: No Rash Neuro: nonfocal MSK: no peripheral joint swelling/tenderness/warmth; back spines nontender      Labs: Lab Results  Component Value Date   WBC 13.1 (H) 08/07/2022   HGB 8.7 (L) 08/07/2022   HCT 28.1 (L) 08/07/2022   MCV 80.5 08/07/2022   PLT 518 (H) 08/07/2022   Last metabolic panel Lab Results  Component Value Date   GLUCOSE 100 (H) 08/07/2022   NA 140 08/07/2022   K 4.0 08/07/2022   CL 104 08/07/2022   CO2 26 08/07/2022   BUN 11 08/07/2022   CREATININE 1.11 (H) 08/07/2022   GFRNONAA >60 07/17/2022   CALCIUM 9.2 08/07/2022   PROT 8.4 (H) 08/07/2022   ALBUMIN 3.3  (L) 07/15/2022   BILITOT 0.5 08/07/2022   ALKPHOS 59 07/15/2022   AST 47 (H) 08/07/2022   ALT 83 (H) 08/07/2022   ANIONGAP 6 07/17/2022    Micro:  Serology:  Imaging: 9/25 ct abd pelv Findings are most consistent with pelvic inflammatory disease with tubo-ovarian abscess along the left posterior uterus, measuring up to 5.8 x 5.1 x 5.1 cm. Extensive stranding in the pelvis and small volume abdominopelvic  ascites. A component of postprocedural change in the pelvis is possible, however, this would be a diagnosis of exclusion.   Assessment & Plan:   Problem List Items Addressed This Visit   None Visit Diagnoses     TOA (tubo-ovarian abscess)    -  Primary   Relevant Orders   CBC   C-reactive protein   COMPLETE METABOLIC PANEL WITH GFR         Abx: 9/28-9/30 vanc/flagyl/ceftriaxone  9/30-c amox-clav   ASSESSMENT: TOA post ovarian sampling; 5 cm abscess on ct scan Sepsis resolved Endometriosis    Sepsis resolved as of 9/28. Clinically doing better 9/28 bcx while on abx ngtd If continued improvement by tomorrow, given negative std testing (and low risk), can discharge on amox-clav Planning 6 weeks abx on hand and will see in rcid clinic to determine further treatment course/evaluation   -------- 08/07/22 assessment Doing well Discussed 2 ways of going about the small abscess. 6 weeks and then monitor off abx vs repeat ct and see (which ct can lag behind tx success in terms of sterility of abscess and body self resorbing it)  Agree to do treatment and then observe off abx  -finish 3 more weeks abx -f/u 6-7 weeks -labs today  -advise to discuss with obgyn to see if any concern about dvt on aygestin given prior episode of ruq pain with some pleuritic quality (resolved now)  -------- 09/24/22 id assessment Off abx 4 weeks now Some discomfort left lower quadrant and an isolated fever. Not sure what to make of this yet. Given these sx will get both labs and ct  scan. Nontender today on exam   F/u 1 week after ct done; video visit ok F/u sooner if fever/chill persistent abd pain   Follow-up: Return in about 4 weeks (around 10/22/2022).   --------- I called patient about extremely elevated crp 260 Given her discomfort and fever I would rather have her back on abx while waiting for the ct scan which could be another 2-3 weeks  She has lots of gi sx with amox-clav. I discuss cefadroxil/flagyl as another possibility. She is trying to get pregnant and I also spoke with obgyn on call for the day and flagyl would be ok to use.  60 days with 30 days 1 refill rx to [facility]  Patient acknowledged   Raymondo Band, MD [facility] 09/24/2022, 4:31 PM

## 2022-09-25 ENCOUNTER — Other Ambulatory Visit: Payer: Self-pay

## 2022-09-25 LAB — COMPLETE METABOLIC PANEL WITH GFR
AG Ratio: 0.7 (calc) — ABNORMAL LOW (ref 1.0–2.5)
ALT: 19 U/L (ref 6–29)
AST: 18 U/L (ref 10–30)
Albumin: 3.4 g/dL — ABNORMAL LOW (ref 3.6–5.1)
Alkaline phosphatase (APISO): 132 U/L — ABNORMAL HIGH (ref 31–125)
BUN: 7 mg/dL (ref 7–25)
CO2: 31 mmol/L (ref 20–32)
Calcium: 9.1 mg/dL (ref 8.6–10.2)
Chloride: 100 mmol/L (ref 98–110)
Creat: 0.77 mg/dL (ref 0.50–0.97)
Globulin: 4.8 g/dL (calc) — ABNORMAL HIGH (ref 1.9–3.7)
Glucose, Bld: 85 mg/dL (ref 65–99)
Potassium: 3.7 mmol/L (ref 3.5–5.3)
Sodium: 139 mmol/L (ref 135–146)
Total Bilirubin: 0.3 mg/dL (ref 0.2–1.2)
Total Protein: 8.2 g/dL — ABNORMAL HIGH (ref 6.1–8.1)
eGFR: 101 mL/min/{1.73_m2} (ref >=60)

## 2022-09-25 LAB — CBC
HCT: 24.4 % — ABNORMAL LOW (ref 35.0–45.0)
Hemoglobin: 7.4 g/dL — ABNORMAL LOW (ref 11.7–15.5)
MCH: 22.8 pg — ABNORMAL LOW (ref 27.0–33.0)
MCHC: 30.3 g/dL — ABNORMAL LOW (ref 32.0–36.0)
MCV: 75.1 fL — ABNORMAL LOW (ref 80.0–100.0)
MPV: 10.6 fL (ref 7.5–12.5)
Platelets: 664 10*3/uL — ABNORMAL HIGH (ref 140–400)
RBC: 3.25 10*6/uL — ABNORMAL LOW (ref 3.80–5.10)
RDW: 19.8 % — ABNORMAL HIGH (ref 11.0–15.0)
WBC: 6.9 10*3/uL (ref 3.8–10.8)

## 2022-09-25 LAB — C-REACTIVE PROTEIN: CRP: 271.3 mg/L — ABNORMAL HIGH (ref <8.0)

## 2022-09-25 MED ORDER — ONDANSETRON HCL 4 MG PO TABS: 4.0000 mg | ORAL_TABLET | Freq: Three times a day (TID) | ORAL | 0 refills | Status: DC | PRN

## 2022-09-25 MED ORDER — AMOXICILLIN-POT CLAVULANATE 875-125 MG PO TABS: 1.0000 | ORAL_TABLET | Freq: Two times a day (BID) | ORAL | 1 refills | Status: DC

## 2022-09-25 MED ORDER — METRONIDAZOLE 500 MG PO TABS: 500.0000 mg | ORAL_TABLET | Freq: Two times a day (BID) | ORAL | 1 refills | Status: AC

## 2022-09-25 MED ORDER — CEFADROXIL 500 MG PO CAPS: 1000.0000 mg | ORAL_CAPSULE | Freq: Two times a day (BID) | ORAL | 1 refills | Status: AC

## 2022-09-25 NOTE — Addendum Note (Signed)
Addended byRutha Bouchard T on: 09/25/2022 02:11 PM   Modules accepted: Orders

## 2022-09-26 ENCOUNTER — Telehealth: Payer: Self-pay

## 2022-09-26 NOTE — Telephone Encounter (Signed)
Patient called wanting clarification on which antibiotics she should be taking since she does not tolerate Augmentin well.   Per Dr. Orlando Penner note, alternative to Augmentin was cefadroxil + metronidazole. Patient has received the metronidazole, but is waiting on cefadroxil to be in stock at [facility]. States this is being taken care of.   She is also asking about ondansetron, relayed that this was also sent in yesterday. Patient verbalized understanding and has no further questions.   Sandie Ano, RN

## 2022-09-27 ENCOUNTER — Encounter: Payer: Self-pay | Admitting: Internal Medicine

## 2022-10-03 ENCOUNTER — Ambulatory Visit (HOSPITAL_COMMUNITY)
Admission: RE | Admit: 2022-10-03 | Discharge: 2022-10-03 | Disposition: A | Discharge status: Home / Self Care | Payer: No Typology Code available for payment source | Source: Ambulatory Visit | Attending: Internal Medicine | Admitting: Internal Medicine

## 2022-10-03 DIAGNOSIS — N7093 Salpingitis and oophoritis, unspecified: Secondary | ICD-10-CM | POA: Diagnosis present

## 2022-10-03 MED ORDER — IOHEXOL 350 MG/ML SOLN
75.0000 mL | Freq: Once | INTRAVENOUS | Status: AC | PRN
  Administered 2022-10-03: 75 mL via INTRAVENOUS

## 2022-10-04 ENCOUNTER — Telehealth (INDEPENDENT_AMBULATORY_CARE_PROVIDER_SITE_OTHER): Payer: No Typology Code available for payment source

## 2022-10-04 DIAGNOSIS — N739 Female pelvic inflammatory disease, unspecified: Secondary | ICD-10-CM

## 2022-10-04 NOTE — Telephone Encounter (Signed)
I had restarted her on amox-clav prior to ordering the ct due to new fever/recurrent pelvic pain  Ct scan reviewed  Will place referral to ir to request drain placement/culture  She'll need to discuss this with her obgyn as well   Phone messaged patient who acknowledge -- she'll contact her obgyn She reported the pharmacy ran out of abx so she just picked up today and starting it. She feels well and no fever since my last visit   She might need iv abx but if ir is able to put a drain in , then oral abx probably ok  Advised patient to see one of [place] next week.    I verified that I was speaking with the correct person using two identifiers. Due to the COVID-19 Pandemic, this service was provided via telemedicine using audio/visual media.   The patient was located at home. The provider was located in the office. The patient did consent to this visit and is aware of charges through their insurance as well as the limitations of evaluation and management by telemedicine. Other persons participating in this telemedicine service were none. Time spent on visit was greater than 10 minutes on media and in coordination of care, with 5 minutes direct conversation with patient

## 2022-10-04 NOTE — Telephone Encounter (Addendum)
Received results of scan of abdomen and pelvis at 0845 by [facility].   Report also reported to provider.   IMPRESSION: 1. Increased size 8.8 x 7.3 cm tubo-ovarian abscess arising from the left adnexa. 2. Increased pelvic free fluid with pelvic peritoneal hyperenhancement compatible with peritonitis. 3. New developing fluid collection in the right hemipelvis measures 3.7 x 1.2 cm. 4. New mild left hydronephrosis to the level of the walled-off fluid collections in the pelvis.  Valarie Cones, LPN

## 2022-10-04 NOTE — Addendum Note (Signed)
Addended by: Rutha Bouchard T on: 10/04/2022 09:09 AM   Modules accepted: Orders, Level of Service

## 2022-10-08 ENCOUNTER — Other Ambulatory Visit (HOSPITAL_COMMUNITY): Payer: Self-pay | Admitting: Internal Medicine

## 2022-10-08 DIAGNOSIS — N739 Female pelvic inflammatory disease, unspecified: Secondary | ICD-10-CM

## 2022-10-10 ENCOUNTER — Telehealth: Payer: Self-pay

## 2022-10-10 ENCOUNTER — Ambulatory Visit: Payer: No Typology Code available for payment source | Admitting: Infectious Diseases

## 2022-10-10 ENCOUNTER — Other Ambulatory Visit: Payer: Self-pay

## 2022-10-10 ENCOUNTER — Encounter: Payer: Self-pay | Admitting: Infectious Diseases

## 2022-10-10 VITALS — BP 139/85 | HR 92 | Temp 98.4°F | Wt 207.0 lb

## 2022-10-10 DIAGNOSIS — N739 Female pelvic inflammatory disease, unspecified: Secondary | ICD-10-CM

## 2022-10-10 DIAGNOSIS — Z114 Encounter for screening for human immunodeficiency virus [HIV]: Secondary | ICD-10-CM

## 2022-10-10 DIAGNOSIS — Z5181 Encounter for therapeutic drug level monitoring: Secondary | ICD-10-CM

## 2022-10-10 NOTE — Progress Notes (Addendum)
Patient Active Problem List   Diagnosis Date Noted   Patellofemoral pain syndrome 09/06/2022   Abdominal pain with fever after surgery 07/16/2022   Tubo-ovarian abscess 07/15/2022   Positive ANA (antinuclear antibody) 02/28/2022   Menstrual disorder 02/27/2022   Family history of other endocrine, nutritional and metabolic diseases 0000000   Menorrhagia 02/27/2022   Female infertility 02/27/2022   Prediabetes 02/27/2022   Vitamin D deficiency 02/27/2022   Essential hypertension 09/17/2021   Goiter 01/11/2013   Current Outpatient Medications on File Prior to Visit  Medication Sig Dispense Refill   aspirin EC 81 MG tablet Take 81 mg by mouth daily. Swallow whole.     Boric Acid Vaginal 600 MG SUPP 600 mg     cefadroxil (DURICEF) 500 MG capsule Take 2 capsules (1,000 mg total) by mouth 2 (two) times daily. 120 capsule 1   Ibuprofen-Acetaminophen (ADVIL DUAL ACTION) 125-250 MG TABS      letrozole (FEMARA) 2.5 MG tablet Take 2.5 mg by mouth daily.     metroNIDAZOLE (FLAGYL) 500 MG tablet Take 1 tablet (500 mg total) by mouth 2 (two) times daily. 60 tablet 1   Multiple Vitamin (MULTIVITAMIN WITH MINERALS) TABS tablet Take 1 tablet by mouth daily.     norethindrone (AYGESTIN) 5 MG tablet Take 2.5 mg by mouth in the morning and at bedtime.     ondansetron (ZOFRAN) 4 MG tablet Take 1 tablet (4 mg total) by mouth every 8 (eight) hours as needed for nausea or vomiting. 20 tablet 0   Prenatal Vit-Fe Fumarate-FA (MULTIVITAMIN-PRENATAL) 27-0.8 MG TABS tablet Take 1 tablet by mouth daily at 12 noon.     No current facility-administered medications on file prior to visit.   Subjective: 36 y o Female with PMH as below including ovarian endometriosis and infertility as well as uterine adenomyosis causing frequent abnormal uterine bleeding episodes s/p ovarian stimulation followed by an uneventful transvaginal ultrasound guided bilateral ovarian follicle aspiration for in vitro fertilization on  07/02/2022 followed by letrozole/norethidrone with course complicated by Left TOA late 06/2022  ( unable to be drained by IR with no microbiologic diagnosis) s/p 6 weeks of PO augmentin, EOT early Nov  2023 then monitored off abtx followed by my partner Dr Gale Journey who is here for fu after her recent CT abdomen/pelvis had worsening findings of infection as below with elevated inflammatory markers. She has been taking cefadroxil as well as metronidazole as prescribed since 12/6 without missing doses. She is thankful that she is able to tolerate both abtx well unlike Augmentin which she was on previously.   She tells me her regular Gyn Dr Garwin Brothers is aware about the CT abd/pelvis findings and agrees to IR consult for drainage. She has not been scheduled with IR yet but reports having follow up with Gyn  In regards to her symptoms, no symptoms whatsoever. The only symptom she had was low grade fevers with some abdominal pain during Thanks giving time when she was overexerting. Otherwise,  denies fevers, chills and sweats.  denies nausea, vomiting, abdominal pain. 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 She also tells me she had yeast infection in the past in her breast area.   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    Review of Systems: all systems reviewed with pertinent positives and negatives as listed above   Past Medical History:  Diagnosis Date   Essential hypertension 09/17/2021   Hypertension    Prediabetes    Rupture of ovarian cyst    Past Surgical History:  Procedure Laterality Date   DILATATION &  CURETTAGE/HYSTEROSCOPY WITH MYOSURE N/A 08/09/2020   Procedure: DILATATION & CURETTAGE/HYSTEROSCOPY WITH MYOSURE;  Surgeon: Gerald Leitz, MD;  Location: [facility];  Service: Gynecology;  Laterality: N/A;   HYSTEROSCOPY  2022   WISDOM TOOTH EXTRACTION      Social History   Tobacco Use   Smoking status: Never    Passive exposure: Never   Smokeless tobacco: Never  Vaping Use   Vaping Use: Never used  Substance Use Topics   Alcohol use: Not Currently    Comment: social   Drug use: No    Family History  Problem Relation Age of Onset   Thyroid disease Mother    Crohn's disease Mother    High Cholesterol Father    Hypertension Father    Stroke Maternal Grandmother    Heart failure Maternal Grandfather    Breast cancer Maternal Grandfather        in 72's   Diabetes Maternal Grandfather    Stroke Paternal Grandmother     Allergies  Allergen Reactions   Elemental Sulfur Rash   Iodinated Contrast Media Nausea And Vomiting   Sulfa Antibiotics Other (See Comments)   Sulfamethoxazole Rash    Health Maintenance  Topic Date Due   COVID-19 Vaccine (1) Never done   HIV Screening  Never done   Hepatitis C Screening  Never done   DTaP/Tdap/Td (1 - Tdap) Never done   PAP SMEAR-Modifier  Never done   INFLUENZA VACCINE  Never done   HPV VACCINES  Aged Out    Objective: BP 139/85   Pulse 92   Temp 98.4 F (36.9 C) (Oral)   Wt 207 lb (93.9 kg)   SpO2 100%   BMI 31.47 kg/m    Physical Exam Constitutional:      Appearance: Normal appearance.  HENT:     Head: Normocephalic and atraumatic.      Mouth: Mucous membranes are moist.  Eyes:    Conjunctiva/sclera: Conjunctivae normal.     Pupils: Pupils are equal, round, bilaterally symmetrical   Cardiovascular:     Rate and Rhythm: Normal rate and regular rhythm.     Heart sounds:   Pulmonary:     Effort: Pulmonary effort is normal.     Breath sounds: Normal breath sounds.   Abdominal:     General: Non  distended     Palpations: soft. Non tender, BS normal   Musculoskeletal:        General: Normal range of motion.   Skin:    General: Skin is warm and dry.     Comments:  Neurological:     General: grossly non focal     Mental Status: awake, alert and oriented to person, place, and time.   Psychiatric:        Mood and Affect: Mood normal.   Lab Results Lab Results  Component Value Date   WBC 6.9 09/24/2022   HGB 7.4 (L) 09/24/2022   HCT 24.4 (L) 09/24/2022   MCV  75.1 (L) 09/24/2022   PLT 664 (H) 09/24/2022    Lab Results  Component Value Date   CREATININE 0.77 09/24/2022   BUN 7 09/24/2022   NA 139 09/24/2022   K 3.7 09/24/2022   CL 100 09/24/2022   CO2 31 09/24/2022    Lab Results  Component Value Date   ALT 19 09/24/2022   AST 18 09/24/2022   ALKPHOS 59 07/15/2022   BILITOT 0.3 09/24/2022    No results found for: "CHOL", "HDL", "LDLCALC", "LDLDIRECT", "TRIG", "CHOLHDL" No results found for: "LABRPR", "RPRTITER" No results found for: "HIV1RNAQUANT", "HIV1RNAVL", "CD4TABS"   Microbiology  Results for orders placed or performed during the hospital encounter of 07/15/22  Wet prep, genital     Status: None   Collection Time: 07/15/22  1:35 PM   Specimen: PATH Cytology Cervicovaginal Ancillary Only  Result Value Ref Range Status   Yeast Wet Prep HPF POC NONE SEEN NONE SEEN Final   Trich, Wet Prep NONE SEEN NONE SEEN Final   Clue Cells Wet Prep HPF POC NONE SEEN NONE SEEN Final   WBC, Wet Prep HPF POC <10 <10 Final   Sperm NONE SEEN  Final    Comment: Performed at [facility], [address], [place], [place] [address]  Culture, blood (Routine X 2) w Reflex to ID Panel     Status: None   Collection Time: 07/18/22 10:48 AM   Specimen: BLOOD LEFT ARM  Result Value Ref Range Status   Specimen Description BLOOD LEFT ARM  Final   Special Requests   Final    BOTTLES DRAWN AEROBIC AND ANAEROBIC Blood Culture adequate volume   Culture   Final    NO GROWTH 5  DAYS Performed at [facility], [place] [address] [place], [place] [address]    Report Status 07/23/2022 FINAL  Final  Culture, blood (Routine X 2) w Reflex to ID Panel     Status: None   Collection Time: 07/18/22 10:48 AM   Specimen: BLOOD LEFT ARM  Result Value Ref Range Status   Specimen Description BLOOD LEFT ARM  Final   Special Requests   Final    BOTTLES DRAWN AEROBIC AND ANAEROBIC Blood Culture adequate volume   Culture   Final    NO GROWTH 5 DAYS Performed at [facility], [place] [address] [place], [place] [address]    Report Status 07/23/2022 FINAL  Final   Imaging CT ABDOMEN PELVIS W CONTRAST  Result Date: 10/03/2022 CLINICAL DATA:  Follow-up TOA EXAM: CT ABDOMEN AND PELVIS WITH CONTRAST TECHNIQUE: Multidetector CT imaging of the abdomen and pelvis was performed using the standard protocol following bolus administration of intravenous contrast. RADIATION DOSE REDUCTION: This exam was performed according to the departmental dose-optimization program which includes automated exposure control, adjustment of the mA and/or kV according to patient size and/or use of iterative reconstruction technique. CONTRAST:  26mL OMNIPAQUE IOHEXOL 350 MG/ML SOLN COMPARISON:  CT July 15, 2022 FINDINGS: Lower chest: No acute abnormality. Hepatobiliary: Hypodensity in segment IV along the falciform ligament commonly reflects focal fatty infiltration or differential perfusion (veins of set [place]). Gallbladder is unremarkable. No biliary ductal dilation. Pancreas: No pancreatic ductal dilation or evidence of acute inflammation. Spleen: No splenomegaly or focal splenic lesion. Adrenals/Urinary Tract: Bilateral adrenal glands appear normal. New mild left hydronephrosis to the level of the walled-off fluid collections in the pelvis. No right-sided hydronephrosis. Urinary bladder is minimally distended limiting evaluation. Stomach/Bowel: Stomach is unremarkable for  degree of distension. No  pathologic dilation of small or large bowel. The appendix and terminal ileum appear normal. Vascular/Lymphatic: Normal caliber abdominal aorta. Duplication of the IVC. Similar prominent retroperitoneal lymph nodes. Reproductive: Increased size of the multiloculated thick walled collection arising from the left adnexa measuring 8.8 x 7.3 cm on image 71/3 previously 5.8 x 5.1 cm with increased prominence of the tubular component. Increased pelvic free fluid with pelvic peritoneal hyperenhancement. Developing fluid collection in the right hemipelvis measures 3.7 x 1.2 cm on image 63/3. Other: No pneumoperitoneum.  No portal venous gas. Musculoskeletal: No acute osseous abnormality. IMPRESSION: 1. Increased size 8.8 x 7.3 cm tubo-ovarian abscess arising from the left adnexa. 2. Increased pelvic free fluid with pelvic peritoneal hyperenhancement compatible with peritonitis. 3. New developing fluid collection in the right hemipelvis measures 3.7 x 1.2 cm. 4. New mild left hydronephrosis to the level of the walled-off fluid collections in the pelvis. These results will be called to the ordering clinician or representative by the Radiologist Assistant, and communication documented in the PACS or [employer]. Electronically Signed   By: Dahlia Bailiff M.D.   On: 10/03/2022 18:35    Assessment/Plan  # TOA ( 8.8*7.3cm) left adnexa with peritoneal enhancement concerning for peritonitis  # RT pelvic abscess  # New mild left hydronephrosis - likely reactive 2/2 above  9/25 urine GC negative 9/28 blood cx NG fina;   Continue cefadroxil 1g po bid and metronidazole 500mg  po bid for 2 weeks Spoke with IR Dr Earleen Newport who thinks poor windows for drain placement but they will try to place a drain on 1 area at least and send aspirate for cultures.  Discussed to fu with GYN as well who are aware about the new CT findings as it seems source control would be difficult with abtx and limited IR procedures   Labs today  including HIV  Fu in 2 weeks after IR and Gyn Fu   She is asymptomatic whatsoever today ( possibly turning chronic) but discussed with her to go to ED in case she has fevers, worsening abdominal pain or any concerning symptoms. She is agreeable to the plan.   I have personally spent more than 50  minutes involved in face-to-face and non-face-to-face activities for this patient on the day of the visit. Professional time spent includes the following activities: Preparing to see the patient (review of tests), Obtaining and/or reviewing separately obtained history (admission/discharge record), Performing a medically appropriate examination and/or evaluation , Ordering medications/tests/procedures, referring and communicating with other health care professionals, Documenting clinical information in the EMR, Independently interpreting results (not separately reported), Communicating results to the patient/family/caregiver, Counseling and educating the patient/family/caregiver and Care coordination (not separately reported).   Wilber Oliphant, [facility] 10/10/2022, 2:59 PM

## 2022-10-10 NOTE — Telephone Encounter (Signed)
Per Dr. Elinor Parkinson reached out to IR regarding IR consult. Spoke with Leodis Rains who states consult is still pending.  Juanita Laster, RMA

## 2022-10-11 LAB — CBC
HCT: 31.5 % — ABNORMAL LOW (ref 35.0–45.0)
Hemoglobin: 9.8 g/dL — ABNORMAL LOW (ref 11.7–15.5)
MCH: 23.4 pg — ABNORMAL LOW (ref 27.0–33.0)
MCHC: 31.1 g/dL — ABNORMAL LOW (ref 32.0–36.0)
MCV: 75.4 fL — ABNORMAL LOW (ref 80.0–100.0)
MPV: 10.4 fL (ref 7.5–12.5)
Platelets: 470 10*3/uL — ABNORMAL HIGH (ref 140–400)
RBC: 4.18 10*6/uL (ref 3.80–5.10)
RDW: 19.9 % — ABNORMAL HIGH (ref 11.0–15.0)
WBC: 4.9 10*3/uL (ref 3.8–10.8)

## 2022-10-11 LAB — COMPREHENSIVE METABOLIC PANEL
AG Ratio: 1 (calc) (ref 1.0–2.5)
ALT: 9 U/L (ref 6–29)
AST: 11 U/L (ref 10–30)
Albumin: 4 g/dL (ref 3.6–5.1)
Alkaline phosphatase (APISO): 70 U/L (ref 31–125)
BUN: 9 mg/dL (ref 7–25)
CO2: 26 mmol/L (ref 20–32)
Calcium: 9.6 mg/dL (ref 8.6–10.2)
Chloride: 103 mmol/L (ref 98–110)
Creat: 0.72 mg/dL (ref 0.50–0.97)
Globulin: 4.2 g/dL (calc) — ABNORMAL HIGH (ref 1.9–3.7)
Glucose, Bld: 100 mg/dL — ABNORMAL HIGH (ref 65–99)
Potassium: 3.9 mmol/L (ref 3.5–5.3)
Sodium: 136 mmol/L (ref 135–146)
Total Bilirubin: 0.4 mg/dL (ref 0.2–1.2)
Total Protein: 8.2 g/dL — ABNORMAL HIGH (ref 6.1–8.1)

## 2022-10-11 LAB — SEDIMENTATION RATE: Sed Rate: 60 mm/h — ABNORMAL HIGH (ref 0–20)

## 2022-10-11 LAB — C-REACTIVE PROTEIN: CRP: 18 mg/L — ABNORMAL HIGH (ref <8.0)

## 2022-10-11 LAB — HIV ANTIBODY (ROUTINE TESTING W REFLEX): HIV 1&2 Ab, 4th Generation: NONREACTIVE

## 2022-10-11 NOTE — Telephone Encounter (Signed)
Pt called due to a text message she received. Per Raynelle Fanning in IR, this is still pending and waiting on approval.  Please reach out to pt when you receive.. Thank you!

## 2022-10-13 DIAGNOSIS — Z114 Encounter for screening for human immunodeficiency virus [HIV]: Secondary | ICD-10-CM | POA: Insufficient documentation

## 2022-10-16 ENCOUNTER — Telehealth: Payer: Self-pay

## 2022-10-16 NOTE — Progress Notes (Signed)
April Mor, DO  Leodis Rains D OK for CT guided drainage attempt.  Window might close at time of attempt. .  Discussed with ID. Currently no need for hospitalization  [address]

## 2022-10-16 NOTE — Telephone Encounter (Signed)
Patient is aware of lab results and recommendation and had no further questions.

## 2022-10-16 NOTE — Telephone Encounter (Signed)
-----   Message from Odette Fraction, MD sent at 10/16/2022  1:51 PM EST ----- Labs noted  CRP is downtrening  which is good thing from infection standpoint Continue abtx as recommended and fu with Dr Renold Don

## 2022-10-16 NOTE — Telephone Encounter (Signed)
Attempted to call patient regarding lab results. Not able to reach him at this time. Left voicemail requesting call back. Juanita Laster, RMA

## 2022-10-23 ENCOUNTER — Other Ambulatory Visit: Payer: Self-pay

## 2022-10-23 ENCOUNTER — Ambulatory Visit (INDEPENDENT_AMBULATORY_CARE_PROVIDER_SITE_OTHER): Payer: No Typology Code available for payment source | Admitting: Internal Medicine

## 2022-10-23 ENCOUNTER — Encounter: Payer: Self-pay | Admitting: Internal Medicine

## 2022-10-23 VITALS — BP 142/85 | HR 96 | Temp 97.8°F | Ht 68.0 in | Wt 200.0 lb

## 2022-10-23 DIAGNOSIS — K651 Peritoneal abscess: Secondary | ICD-10-CM

## 2022-10-23 NOTE — Patient Instructions (Signed)
You do not have thrush. There might be a generalized or back of tongue color change which is not uncommon  Thrush would be sometimes with pain when swallowing, or white coating that is usually in random pattern and can be scraped off that migt cause some bleeding   Continue current cefadroxil and flagyl antibiotics. I anticipate at least until end of next month   Please make sure you ask the radiologist to send for bacteria culture when the drain is placed   See [place] in around 6 weeks

## 2022-10-23 NOTE — Progress Notes (Signed)
[facility]  Patient Active Problem List   Diagnosis Date Noted   Screening for HIV (human immunodeficiency virus) 10/13/2022   Medication monitoring encounter 10/10/2022   Pelvic abscess in female 10/10/2022   Patellofemoral pain syndrome 09/06/2022   Abdominal pain with fever after surgery 07/16/2022   Tubo-ovarian abscess 07/15/2022   Positive ANA (antinuclear antibody) 02/28/2022   Menstrual disorder 02/27/2022   Family history of other endocrine, nutritional and metabolic diseases 79/89/2119   Menorrhagia 02/27/2022   Female infertility 02/27/2022   Prediabetes 02/27/2022   Vitamin D deficiency 02/27/2022   Essential hypertension 09/17/2021   Goiter 01/11/2013      Subjective:    Patient ID: April Holland, female    DOB: 1986-05-11, 37 y.o.   MRN: [id number]  Chief Complaint  Patient presents with   Follow-up    TOA (tubo-ovarian abscess)    HPI:  April Holland is a 37 y.o. female endometriosis here for hospital f/u TOA post ovarian sampling for preparation of fertility planning  She had a 5 cm abscess on ct scan Sepsis on presentation; negative bcx Negative std screen No sampling possible Started on iv bsAbx transitioned to amox-clav planned 6 weeks on discharge on 07/20/22  Reports ruq pain but no f/c and not present at this time. One episode about 1 week ago. No hx prior biliary stone. Wasn't triggered after a meal but after she woke up from nap. It hurts more when she takes a deep breath  No cough/short of breath No n/v/diarrhea/rash  Her stamina is improving but not back yet  She is still on hormone. Non-smoker  Asked about management of constipation. Taking advil not opioid  She has about 3 more weeks to go with antibiotics   ------------- 09/24/22 id f/u She is done with abx for about 4 weeks Back on her hormone suppresion No f/c Some discomfort left lower quadrant but has a lot [place]. This has gotten better She feels a little bloated otherwise Appetite good Have had a fever the last week 101. Never had fever with hormone suppression therapy for endometriosis. Covid test was negative. No URI sx.  10/23/22 id clinic f/u Patient had repeat ct that showed recurrence of abscess Restarted on cefadroxil/flagyl Will have IR drain placement 1/19 No f/c/abd pain since 12/5 visit Asked if she has thrush (no sx but tonge appears white)  No n/v/diarrhea/rash  Allergies  Allergen Reactions   Elemental Sulfur Rash   Iodinated Contrast Media Nausea And Vomiting   Sulfa Antibiotics Other (See Comments)   Sulfamethoxazole Rash      Outpatient Medications Prior to Visit  Medication Sig Dispense Refill   aspirin EC 81 MG tablet Take 81 mg by mouth daily. Swallow whole.     Boric Acid Vaginal 600 MG SUPP 600 mg     cefadroxil (DURICEF) 500 MG capsule Take 2 capsules (1,000 mg total) by mouth 2 (two) times daily. 120 capsule 1   Ibuprofen-Acetaminophen (ADVIL DUAL ACTION) 125-250 MG TABS      letrozole (FEMARA) 2.5 MG tablet Take 2.5 mg by mouth daily.     metroNIDAZOLE (FLAGYL) 500 MG tablet Take 1 tablet (500 mg total) by mouth 2 (two) times daily. 60 tablet 1   Multiple Vitamin (MULTIVITAMIN WITH MINERALS) TABS tablet Take 1 tablet by mouth daily.     norethindrone (AYGESTIN) 5 MG tablet Take 2.5 mg  by mouth in the morning and at bedtime.     Prenatal Vit-Fe Fumarate-FA (MULTIVITAMIN-PRENATAL) 27-0.8 MG TABS tablet Take 1 tablet by mouth daily at 12 noon.     ondansetron (ZOFRAN) 4 MG tablet Take 1 tablet (4 mg total) by mouth every 8 (eight) hours as needed for nausea or vomiting. 20 tablet 0   No facility-administered medications prior to visit.     Social History   Socioeconomic History   Marital status: Married    Spouse name: Not on file   Number of children: Not on file   Years of education: Not on file   Highest education level:  Not on file  Occupational History   Not on file  Tobacco Use   Smoking status: Never    Passive exposure: Never   Smokeless tobacco: Never  Vaping Use   Vaping Use: Never used  Substance and Sexual Activity   Alcohol use: Not Currently    Comment: social   Drug use: No   Sexual activity: Yes  Other Topics Concern   Not on file  Social History Narrative   Not on file   Social Determinants of Health   Financial Resource Strain: Low Risk  (09/17/2021)   Overall Financial Resource Strain (CARDIA)    Difficulty of Paying Living Expenses: Not very hard  Food Insecurity: No Food Insecurity (07/15/2022)   Hunger Vital Sign    Worried About Running Out of Food in the Last Year: Never true    Ran Out of Food in the Last Year: Never true  Transportation Needs: No Transportation Needs (07/15/2022)   PRAPARE - Administrator, Civil Service (Medical): No    Lack of Transportation (Non-Medical): No  Physical Activity: Inactive (09/17/2021)   Exercise Vital Sign    Days of Exercise per Week: 0 days    Minutes of Exercise per Session: 0 min  Stress: Not on file  Social Connections: Not on file  Intimate Partner Violence: Not At Risk (07/15/2022)   Humiliation, Afraid, Rape, and Kick questionnaire    Fear of Current or Ex-Partner: No    Emotionally Abused: No    Physically Abused: No    Sexually Abused: No      Review of Systems    All other ros negative   Objective:    BP (!) 142/85   Pulse 96   Temp 97.8 F (36.6 C) (Temporal)   Ht 5\' 8"  (1.727 m)   Wt 200 lb (90.7 kg)   BMI 30.41 kg/m  Nursing note and vital signs reviewed.  Physical Exam     General/constitutional: no distress, pleasant HEENT: Normocephalic, PER, Conj Clear, EOMI, Oropharynx clear -- no sign of thrush Neck supple CV: rrr no mrg Lungs: clear to auscultation, normal respiratory effort Abd: Soft, Nontender Ext: no edema Skin: No Rash Neuro: nonfocal MSK: no peripheral joint  swelling/tenderness/warmth; back spines nontender       Labs: Lab Results  Component Value Date   WBC 4.9 10/10/2022   HGB 9.8 (L) 10/10/2022   HCT 31.5 (L) 10/10/2022   MCV 75.4 (L) 10/10/2022   PLT 470 (H) 10/10/2022   Last metabolic panel Lab Results  Component Value Date   GLUCOSE 100 (H) 10/10/2022   NA 136 10/10/2022   K 3.9 10/10/2022   CL 103 10/10/2022   CO2 26 10/10/2022   BUN 9 10/10/2022   CREATININE 0.72 10/10/2022   GFRNONAA >60 07/17/2022   CALCIUM 9.6 10/10/2022  PROT 8.2 (H) 10/10/2022   ALBUMIN 3.3 (L) 07/15/2022   BILITOT 0.4 10/10/2022   ALKPHOS 59 07/15/2022   AST 11 10/10/2022   ALT 9 10/10/2022   ANIONGAP 6 07/17/2022    Micro:  Serology:  Imaging: 9/25 ct abd pelv Findings are most consistent with pelvic inflammatory disease with tubo-ovarian abscess along the left posterior uterus, measuring up to 5.8 x 5.1 x 5.1 cm. Extensive stranding in the pelvis and small volume abdominopelvic ascites. A component of postprocedural change in the pelvis is possible, however, this would be a diagnosis of exclusion.  12/14 ct abd pelv 1. Increased size 8.8 x 7.3 cm tubo-ovarian abscess arising from the left adnexa. 2. Increased pelvic free fluid with pelvic peritoneal hyperenhancement compatible with peritonitis. 3. New developing fluid collection in the right hemipelvis measures 3.7 x 1.2 cm. 4. New mild left hydronephrosis to the level of the walled-off fluid collections in the pelvis.  Assessment & Plan:   Problem List Items Addressed This Visit   None Visit Diagnoses     Intra-abdominal abscess ([place])    -  Primary        Abx: 12/06-c cefadroxil/flagyl  9/28-9/30 vanc/flagyl/ceftriaxone  9/30-c amox-clav   ASSESSMENT: TOA post ovarian sampling; 5 cm abscess on ct scan Sepsis resolved Endometriosis    Sepsis resolved as of 9/28. Clinically doing better 9/28 bcx while on abx ngtd If continued improvement by tomorrow,  given negative std testing (and low risk), can discharge on amox-clav Planning 6 weeks abx on hand and will see in rcid clinic to determine further treatment course/evaluation   -------- 08/07/22 assessment Doing well Discussed 2 ways of going about the small abscess. 6 weeks and then monitor off abx vs repeat ct and see (which ct can lag behind tx success in terms of sterility of abscess and body self resorbing it)  Agree to do treatment and then observe off abx  -finish 3 more weeks abx -f/u 6-7 weeks -labs today  -advise to discuss with obgyn to see if any concern about dvt on aygestin given prior episode of ruq pain with some pleuritic quality (resolved now)  -------- 09/24/22 id assessment Off abx 4 weeks now Some discomfort left lower quadrant and an isolated fever. Not sure what to make of this yet. Given these sx will get both labs and ct scan. Nontender today on exam   F/u 1 week after ct done; video visit ok F/u sooner if fever/chill persistent abd pain  ------------ addendum I called patient about extremely elevated crp 260 Given her discomfort and fever I would rather have her back on abx while waiting for the ct scan which could be another 2-3 weeks  She has lots of gi sx with amox-clav. I discuss cefadroxil/flagyl as another possibility. She is trying to get pregnant and I also spoke with obgyn on call for the day and flagyl would be ok to use.  60 days with 30 days 1 refill rx to [facility]  Patient acknowledged    10/23/22 assessment Pending ir drain placement 1/19 Doing well on cefadroxil flagyl no sign of sepsis or abd pain Advise culture during ir drain placement F/u 6 weeks No evidence of thrush as patient query    Follow-up: Return in about 6 weeks (around 12/04/2022).      Jabier Mutton, [facility] 10/23/2022, 4:13 PM

## 2022-11-06 ENCOUNTER — Other Ambulatory Visit: Payer: Self-pay | Admitting: Radiology

## 2022-11-06 DIAGNOSIS — N7093 Salpingitis and oophoritis, unspecified: Secondary | ICD-10-CM

## 2022-11-07 ENCOUNTER — Other Ambulatory Visit: Payer: Self-pay | Admitting: Radiology

## 2022-11-07 ENCOUNTER — Other Ambulatory Visit: Payer: Self-pay | Admitting: Student

## 2022-11-07 ENCOUNTER — Encounter (HOSPITAL_COMMUNITY): Payer: Self-pay

## 2022-11-07 DIAGNOSIS — N7093 Salpingitis and oophoritis, unspecified: Secondary | ICD-10-CM

## 2022-11-07 NOTE — H&P (Addendum)
Chief Complaint: Tubo-ovarian / pelvic abscess. Request is for abscess drain placement/aspiration  Referring Physician(s): Vu,Trung T  Supervising Physician: Markus Daft  Patient Status: [facility] - Out-pt  History of Present Illness: April Holland is a 37 y.o. female outpatient. History of HTN,  ruptured ovarian cyst, egg sampling for IVF. Found to be septic  in September 2023 with a pelvic/tuboovarian abscess not resolved despite several different courses of  antibiotics.Team is requesting an aspiration - drain placement to the tubo-ovarian/ pelvic abscess. Case reviewed and approved by IR Attending Dr. Rolla Plate. Patient is being followed by ID. Patient discussed at am IR meeting. Patient will be scanned for CT pelvis with contrast. Patient will be given 4mg  of zofran prior to scan. Patient is agreeable to the plan of care.   Currently without any significant complaints. Patient alert and laying in bed,calm. Denies any fevers, headache, chest pain, SOB, cough, abdominal pain, nausea, vomiting or bleeding. Return precautions and treatment recommendations and follow-up discussed with the patient  who is agreeable with the plan.    Past Medical History:  Diagnosis Date   Essential hypertension 09/17/2021   Family history of other endocrine, nutritional and metabolic diseases 60/73/7106   Hypertension    Prediabetes    Rupture of ovarian cyst     Past Surgical History:  Procedure Laterality Date   DILATATION & CURETTAGE/HYSTEROSCOPY WITH MYOSURE N/A 08/09/2020   Procedure: DILATATION & CURETTAGE/HYSTEROSCOPY WITH MYOSURE;  Surgeon: Christophe Louis, MD;  Location: [place];  Service: Gynecology;  Laterality: N/A;   HYSTEROSCOPY  2022   WISDOM TOOTH EXTRACTION      Allergies: Elemental sulfur, Iodinated contrast media, Sulfa antibiotics, and Sulfamethoxazole  Medications: Prior to Admission medications   Medication Sig Start Date End Date Taking? Authorizing Provider   aspirin EC 81 MG tablet Take 81 mg by mouth daily. Swallow whole.    [provider]  Boric Acid Vaginal 600 MG SUPP 600 mg 07/16/18   [provider]  cefadroxil (DURICEF) 500 MG capsule Take 2 capsules (1,000 mg total) by mouth 2 (two) times daily. 09/25/22 11/24/22  Vu, Rockey Situ, MD  Ibuprofen-Acetaminophen (ADVIL DUAL ACTION) 125-250 MG TABS     [provider]  letrozole (FEMARA) 2.5 MG tablet Take 2.5 mg by mouth daily.    [provider]  metroNIDAZOLE (FLAGYL) 500 MG tablet Take 1 tablet (500 mg total) by mouth 2 (two) times daily. 09/25/22 11/24/22  Jabier Mutton, MD  Multiple Vitamin (MULTIVITAMIN WITH MINERALS) TABS tablet Take 1 tablet by mouth daily.    [provider]  norethindrone (AYGESTIN) 5 MG tablet Take 2.5 mg by mouth in the morning and at bedtime.    [provider]  Prenatal Vit-Fe Fumarate-FA (MULTIVITAMIN-PRENATAL) 27-0.8 MG TABS tablet Take 1 tablet by mouth daily at 12 noon.    [provider]     Family History  Problem Relation Age of Onset   Thyroid disease Mother    Crohn's disease Mother    High Cholesterol Father    Hypertension Father    Stroke Maternal Grandmother    Heart failure Maternal Grandfather    Breast cancer Maternal Grandfather        in 61's   Diabetes Maternal Grandfather    Stroke Paternal Grandmother     Social History   Socioeconomic History   Marital status: Married    Spouse name: Not on file   Number of children: Not on file  Years of education: Not on file   Highest education level: Not on file  Occupational History   Not on file  Tobacco Use   Smoking status: Never    Passive exposure: Never   Smokeless tobacco: Never  Vaping Use   Vaping Use: Never used  Substance and Sexual Activity   Alcohol use: Not Currently    Comment: social   Drug use: No   Sexual activity: Yes  Other Topics Concern   Not on file  Social History Narrative   Not on file   Social  Determinants of Health   Financial Resource Strain: Low Risk  (09/17/2021)   Overall Financial Resource Strain (CARDIA)    Difficulty of Paying Living Expenses: Not very hard  Food Insecurity: No Food Insecurity (07/15/2022)   Hunger Vital Sign    Worried About Running Out of Food in the Last Year: Never true    Ran Out of Food in the Last Year: Never true  Transportation Needs: No Transportation Needs (07/15/2022)   PRAPARE - Administrator, Civil Service (Medical): No    Lack of Transportation (Non-Medical): No  Physical Activity: Inactive (09/17/2021)   Exercise Vital Sign    Days of Exercise per Week: 0 days    Minutes of Exercise per Session: 0 min  Stress: Not on file  Social Connections: Not on file     Review of Systems: A 12 point ROS discussed and pertinent positives are indicated in the HPI above.  All other systems are negative.  Review of Systems  Constitutional:  Negative for fatigue and fever.  HENT:  Negative for congestion.   Respiratory:  Negative for cough and shortness of breath.   Gastrointestinal:  Negative for abdominal pain, diarrhea, nausea and vomiting.    Vital Signs: BP (!) 157/90   Pulse 88   Temp 97.6 F (36.4 C) (Temporal)   Resp 16   Ht 5\' 8"  (1.727 m)   Wt 199 lb (90.3 kg)   SpO2 96%   BMI 30.26 kg/m     Physical Exam Vitals and nursing note reviewed.  Constitutional:      Appearance: She is well-developed.  HENT:     Head: Normocephalic and atraumatic.  Eyes:     Conjunctiva/sclera: Conjunctivae normal.  Cardiovascular:     Rate and Rhythm: Normal rate and regular rhythm.     Heart sounds: Normal heart sounds.  Pulmonary:     Effort: Pulmonary effort is normal.     Breath sounds: Normal breath sounds.  Musculoskeletal:        General: Normal range of motion.     Cervical back: Normal range of motion.  Skin:    General: Skin is warm.  Neurological:     Mental Status: She is alert and oriented to person, place,  and time.     Imaging: No results found.  Labs:  CBC: Recent Labs    08/07/22 1024 09/24/22 1633 10/10/22 1526 11/08/22 0837  WBC 13.1* 6.9 4.9 5.1  HGB 8.7* 7.4* 9.8* 10.6*  HCT 28.1* 24.4* 31.5* 34.4*  PLT 518* 664* 470* 333    COAGS: Recent Labs    11/08/22 0837  INR 1.0    BMP: Recent Labs    07/15/22 0622 07/17/22 0610 08/07/22 1024 09/24/22 1633 10/10/22 1526  NA 135 136 140 139 136  K 3.6 3.6 4.0 3.7 3.9  CL 102 101 104 100 103  CO2 18* 29 26 31 26   GLUCOSE  161* 119* 100* 85 100*  BUN 6 6 11 7 9   CALCIUM 8.7* 8.0* 9.2 9.1 9.6  CREATININE 1.06* 1.04* 1.11* 0.77 0.72  GFRNONAA >60 >60  --   --   --     LIVER FUNCTION TESTS: Recent Labs    07/15/22 0622 08/07/22 1024 09/24/22 1633 10/10/22 1526  BILITOT 1.5* 0.5 0.3 0.4  AST 49* 47* 18 11  ALT 84* 83* 19 9  ALKPHOS 59  --   --   --   PROT 7.5 8.4* 8.2* 8.2*  ALBUMIN 3.3*  --   --   --       Assessment and Plan:  37 y.o. female outpatient. History of HTN,  ruptured ovarian cyst, egg sampling for IVF. Found to be septic  in September 2023 with a pelvic/tuboovarian abscess not resolved despite several different courses of  antibiotics. Team is requesting an aspiration - drain placement to the tubo-ovarian/ pelvic abscess. Case reviewed and approved by IR Attending Dr. Rolla Plate. Patient is being followed by ID.  Patient discussed at am IR meeting. Patient will be scanned for CT pelvis with contrast. Patient will be given 4mg  of zofran prior to scan. Patient is agreeable to the plan of care.   CT abd pelvis from 12.14.23 reads Increased size 8.8 x 7.3 cm tubo-ovarian abscess arising from the left adnexa. 2. Increased pelvic free fluid with pelvic peritoneal hyperenhancement compatible with peritonitis. 3. New developing fluid collection in the right hemipelvis measures 3.7 x 1.2 cm. Patient is on ASA 81 mg. Last dose on more than a month earlier per patient. Allergies include iodinated contrast  reaction vomiting and sulfa. Patient has been NPO since midnight.   Risks and benefits discussed with the patient including bleeding, infection, damage to adjacent structures, bowel perforation/fistula connection, and sepsis.  All of the patient's questions were answered, patient is agreeable to proceed. Consent signed and in chart.  Thank you for this interesting consult.  I greatly enjoyed meeting CARLISIA GENO and look forward to participating in their care.  A copy of this report was sent to the requesting provider on this date.  Electronically Signed: Jacqualine Mau, NP 11/08/2022, 9:39 AM   I spent a total of  30 Minutes in face to face in clinical consultation, greater than 50% of which was counseling/coordinating care for tubo-ovarian / pelvic abscess drain / aspiration.

## 2022-11-08 ENCOUNTER — Other Ambulatory Visit: Payer: Self-pay | Admitting: Radiology

## 2022-11-08 ENCOUNTER — Ambulatory Visit (HOSPITAL_COMMUNITY)
Admission: RE | Admit: 2022-11-08 | Discharge: 2022-11-08 | Disposition: A | Discharge status: Home / Self Care | Payer: No Typology Code available for payment source | Source: Ambulatory Visit | Attending: Internal Medicine | Admitting: Internal Medicine

## 2022-11-08 ENCOUNTER — Other Ambulatory Visit: Payer: Self-pay

## 2022-11-08 VITALS — BP 137/95 | HR 78 | Temp 97.6°F | Resp 16 | Ht 68.0 in | Wt 199.0 lb

## 2022-11-08 DIAGNOSIS — N7093 Salpingitis and oophoritis, unspecified: Secondary | ICD-10-CM | POA: Insufficient documentation

## 2022-11-08 DIAGNOSIS — N739 Female pelvic inflammatory disease, unspecified: Secondary | ICD-10-CM

## 2022-11-08 LAB — CBC
HCT: 34.4 % — ABNORMAL LOW (ref 36.0–46.0)
Hemoglobin: 10.6 g/dL — ABNORMAL LOW (ref 12.0–15.0)
MCH: 24 pg — ABNORMAL LOW (ref 26.0–34.0)
MCHC: 30.8 g/dL (ref 30.0–36.0)
MCV: 78 fL — ABNORMAL LOW (ref 80.0–100.0)
Platelets: 333 10*3/uL (ref 150–400)
RBC: 4.41 MIL/uL (ref 3.87–5.11)
RDW: 18.8 % — ABNORMAL HIGH (ref 11.5–15.5)
WBC: 5.1 10*3/uL (ref 4.0–10.5)
nRBC: 0 % (ref 0.0–0.2)

## 2022-11-08 LAB — PROTIME-INR
INR: 1 (ref 0.8–1.2)
Prothrombin Time: 13.1 seconds (ref 11.4–15.2)

## 2022-11-08 LAB — PREGNANCY, URINE: Preg Test, Ur: NEGATIVE

## 2022-11-08 MED ORDER — MIDAZOLAM HCL 2 MG/2ML IJ SOLN
INTRAMUSCULAR | Status: AC | PRN
  Administered 2022-11-08: 1 mg via INTRAVENOUS

## 2022-11-08 MED ORDER — SODIUM CHLORIDE 0.9% FLUSH: 5.0000 mL | Freq: Three times a day (TID) | INTRAVENOUS | Status: DC

## 2022-11-08 MED ORDER — SODIUM CHLORIDE 0.9 % IV SOLN
INTRAVENOUS | Status: AC | PRN
  Administered 2022-11-08: 10 mL/h via INTRAVENOUS

## 2022-11-08 MED ORDER — MIDAZOLAM HCL 2 MG/2ML IJ SOLN
INTRAMUSCULAR | Status: AC
  Filled 2022-11-08: qty 2

## 2022-11-08 MED ORDER — FENTANYL CITRATE (PF) 100 MCG/2ML IJ SOLN
INTRAMUSCULAR | Status: AC | PRN
  Administered 2022-11-08: 50 ug via INTRAVENOUS

## 2022-11-08 MED ORDER — FENTANYL CITRATE (PF) 100 MCG/2ML IJ SOLN
INTRAMUSCULAR | Status: AC
  Filled 2022-11-08: qty 2

## 2022-11-08 MED ORDER — ONDANSETRON HCL 4 MG/2ML IJ SOLN
INTRAMUSCULAR | Status: AC
  Filled 2022-11-08: qty 2

## 2022-11-08 MED ORDER — LIDOCAINE HCL (PF) 1 % IJ SOLN
10.0000 mL | Freq: Once | INTRAMUSCULAR | Status: AC
  Administered 2022-11-08: 10 mL

## 2022-11-08 MED ORDER — ONDANSETRON HCL 4 MG/2ML IJ SOLN: 4.0000 mg | Freq: Once | INTRAMUSCULAR | Status: DC

## 2022-11-08 MED ORDER — SODIUM CHLORIDE 0.9 % IV SOLN: INTRAVENOUS | Status: DC

## 2022-11-08 NOTE — Procedures (Signed)
Interventional Radiology Procedure Note  Procedure: Image guided drain placement, LLQ tuboovarian abscess.  72F pigtail drain. ~110cc of frankly purulent fluid  Complications: None  EBL: None Sample: Culture sent  Recommendations: - Routine drain care, with sterile flushes, record output - follow up Cx - routine wound care - Drain education - 1 hr dc home when goals met  Signed,  Dulcy Fanny. Earleen Newport, DO

## 2022-11-11 LAB — AEROBIC/ANAEROBIC CULTURE W GRAM STAIN (SURGICAL/DEEP WOUND)

## 2022-11-12 ENCOUNTER — Other Ambulatory Visit: Payer: Self-pay | Admitting: Obstetrics and Gynecology

## 2022-11-12 DIAGNOSIS — N7093 Salpingitis and oophoritis, unspecified: Secondary | ICD-10-CM

## 2022-11-13 ENCOUNTER — Telehealth: Payer: Self-pay

## 2022-11-13 ENCOUNTER — Other Ambulatory Visit: Payer: Self-pay | Admitting: Internal Medicine

## 2022-11-13 DIAGNOSIS — N7093 Salpingitis and oophoritis, unspecified: Secondary | ICD-10-CM

## 2022-11-13 NOTE — Telephone Encounter (Signed)
Patient left a voicemail stating that she noticed additional appointments have been scheduled since fluid drain on 11/08/2022. Patient is concerned and wanted to know what the additional tests are for.    [place], CMA

## 2022-11-21 ENCOUNTER — Ambulatory Visit
Admission: RE | Admit: 2022-11-21 | Discharge: 2022-11-21 | Disposition: A | Discharge status: Home / Self Care | Payer: No Typology Code available for payment source | Source: Ambulatory Visit | Attending: Radiology | Admitting: Radiology

## 2022-11-21 ENCOUNTER — Ambulatory Visit
Admission: RE | Admit: 2022-11-21 | Discharge: 2022-11-21 | Disposition: A | Discharge status: Home / Self Care | Payer: No Typology Code available for payment source | Source: Ambulatory Visit | Attending: Obstetrics and Gynecology | Admitting: Obstetrics and Gynecology

## 2022-11-21 DIAGNOSIS — N7093 Salpingitis and oophoritis, unspecified: Secondary | ICD-10-CM

## 2022-11-21 MED ORDER — IOPAMIDOL (ISOVUE-300) INJECTION 61%
100.0000 mL | Freq: Once | INTRAVENOUS | Status: AC | PRN
  Administered 2022-11-21: 100 mL via INTRAVENOUS

## 2022-11-21 MED ORDER — IOPAMIDOL (ISOVUE-300) INJECTION 61%
20.0000 mL | Freq: Once | INTRAVENOUS | Status: AC | PRN
  Administered 2022-11-21: 20 mL

## 2022-11-21 NOTE — Progress Notes (Signed)
Referring Physician(s): Dr. Gale Journey  Chief Complaint: The patient is seen in follow up today s/p pelvic/tubo-ovarian abscess drain 11/08/22 by Dr. Earleen Newport.   History of present illness:  April Holland, 37 year old female, has a medical history significant for ovarian endometriosis, infertility and uterine adenomyosis causing frequent abnormal uterine bleeding. She underwent ovarian stimulation followed by a transvaginal ultrasound-guided bilateral ovarian follicle aspiration for in vitro fertilization on 07/02/22. She presented to the [facility] ED 07/15/22 with complaints of worsening abdominal pain and swelling. CT imaging showed pelvic inflammatory disease with a tubo-ovarian abscess along the left posterior uterus measuring 5.8 x 5.1 x 5.1 cm. Interventional Radiology was consulted and requested to evaluate patient for drain placement. Imaging was reviewed by Dr. Denna Haggard and the collection was deemed inaccessible via percutaneous approach. She received supportive care and conservative management with IV antibiotics and her symptoms gradually resolved. She was discharged home 07/20/22 with oral antibiotics. She followed up with GYN and Infectious Disease as an outpatient.   Her symptoms unfortunately returned in December 2023 and CT imaging 10/03/22 showed an increased size of the TOA to 8.8 x 7.3 cm. She was referred back to IR and on 11/08/22 a 12 Fr drain was placed to the LLQ by Dr. Earleen Newport. This procedure was done as an outpatient procedure.  She presents to the [facility] Radiology outpatient clinic today for drain evaluation with CT scan and drain injection. She has not been flushing the drain (she was not instructed to flush the drain) and she reports an average of 5 ml daily output. She remains on PO antibiotics. She denies any pain, nausea, vomiting or diarrhea. She states she feels well and has no complaints.    Past Medical History:  Diagnosis Date   Essential hypertension 09/17/2021    Family history of other endocrine, nutritional and metabolic diseases 95/28/4132   Hypertension    Prediabetes    Rupture of ovarian cyst     Past Surgical History:  Procedure Laterality Date   DILATATION & CURETTAGE/HYSTEROSCOPY WITH MYOSURE N/A 08/09/2020   Procedure: DILATATION & CURETTAGE/HYSTEROSCOPY WITH MYOSURE;  Surgeon: Christophe Louis, MD;  Location: [place];  Service: Gynecology;  Laterality: N/A;   HYSTEROSCOPY  2022   WISDOM TOOTH EXTRACTION      Allergies: Elemental sulfur, Iodinated contrast media, Sulfa antibiotics, and Sulfamethoxazole  Medications: Prior to Admission medications   Medication Sig Start Date End Date Taking? Authorizing Provider  aspirin EC 81 MG tablet Take 81 mg by mouth daily. Swallow whole.    [provider]  Boric Acid Vaginal 600 MG SUPP 600 mg 07/16/18   [provider]  cefadroxil (DURICEF) 500 MG capsule Take 2 capsules (1,000 mg total) by mouth 2 (two) times daily. 09/25/22 11/24/22  Vu, Johnny Bridge T, MD  ferrous sulfate 325 (65 FE) MG EC tablet Take 325 mg by mouth 3 (three) times daily with meals.    [provider]  fluconazole (DIFLUCAN) 150 MG tablet Take 150 mg by mouth once.    [provider]  Ibuprofen-Acetaminophen (ADVIL DUAL ACTION) 125-250 MG TABS     [provider]  letrozole (FEMARA) 2.5 MG tablet Take 2.5 mg by mouth daily.    [provider]  metroNIDAZOLE (FLAGYL) 500 MG tablet Take 1 tablet (500 mg total) by mouth 2 (two) times daily. 09/25/22 11/24/22  Jabier Mutton, MD  Multiple Vitamin (MULTIVITAMIN WITH MINERALS) TABS tablet Take 1 tablet by mouth daily.    [provider]  norethindrone (AYGESTIN) 5 MG tablet Take 2.5 mg by mouth in the morning and at bedtime.    [provider]  Prenatal Vit-Fe Fumarate-FA (MULTIVITAMIN-PRENATAL) 27-0.8 MG TABS tablet Take 1 tablet by mouth daily at 12 noon.    [provider]     Family History   Problem Relation Age of Onset   Thyroid disease Mother    Crohn's disease Mother    High Cholesterol Father    Hypertension Father    Stroke Maternal Grandmother    Heart failure Maternal Grandfather    Breast cancer Maternal Grandfather        in 28's   Diabetes Maternal Grandfather    Stroke Paternal Grandmother     Social History   Socioeconomic History   Marital status: Married    Spouse name: Not on file   Number of children: Not on file   Years of education: Not on file   Highest education level: Not on file  Occupational History   Not on file  Tobacco Use   Smoking status: Never    Passive exposure: Never   Smokeless tobacco: Never  Vaping Use   Vaping Use: Never used  Substance and Sexual Activity   Alcohol use: Not Currently    Comment: social   Drug use: No   Sexual activity: Yes  Other Topics Concern   Not on file  Social History Narrative   Not on file   Social Determinants of Health   Financial Resource Strain: Low Risk  (09/17/2021)   Overall Financial Resource Strain (CARDIA)    Difficulty of Paying Living Expenses: Not very hard  Food Insecurity: No Food Insecurity (07/15/2022)   Hunger Vital Sign    Worried About Running Out of Food in the Last Year: Never true    Ran Out of Food in the Last Year: Never true  Transportation Needs: No Transportation Needs (07/15/2022)   PRAPARE - Administrator, Civil Service (Medical): No    Lack of Transportation (Non-Medical): No  Physical Activity: Inactive (09/17/2021)   Exercise Vital Sign    Days of Exercise per Week: 0 days    Minutes of Exercise per Session: 0 min  Stress: Not on file  Social Connections: Not on file     Vital Signs: BP (!) 140/77   Pulse 73   Temp 98.3 F (36.8 C) (Oral)   SpO2 100% Comment: room air  Physical Exam Constitutional:      General: She is not in acute distress.    Appearance: She is not ill-appearing.  HENT:     Mouth/Throat:     Mouth: Mucous  membranes are moist.     Pharynx: Oropharynx is clear.  Pulmonary:     Effort: Pulmonary effort is normal.  Abdominal:     Palpations: Abdomen is soft.     Tenderness: There is no abdominal tenderness.     Comments: LLQ drain to suction. Scant amount of serosanguineous fluid in bulb. Skin insertion site is unremarkable. Suture and stat-lock in place.   Skin:    General: Skin is warm and dry.  Neurological:     Mental Status: She is alert and oriented to person, place, and time.     Imaging: CT ABDOMEN PELVIS W CONTRAST  Result Date: 11/21/2022 CLINICAL DATA:  Tubo-ovarian abscess.  Follow-up drain placement. EXAM: CT ABDOMEN AND PELVIS WITH CONTRAST TECHNIQUE: Multidetector CT imaging of the abdomen and pelvis was performed using the standard  protocol following bolus administration of intravenous contrast. RADIATION DOSE REDUCTION: This exam was performed according to the departmental dose-optimization program which includes automated exposure control, adjustment of the mA and/or kV according to patient size and/or use of iterative reconstruction technique. CONTRAST:  15mL ISOVUE-300 IOPAMIDOL (ISOVUE-300) INJECTION 61% COMPARISON:  11/08/2022 FINDINGS: Lower chest: Residual subpleural densities along the lateral aspect of the right lower lobe on image 7/4 probably represents focal scarring or atelectasis. No pleural effusions. Hepatobiliary: Normal appearance of the liver, gallbladder and portal venous system. Pancreas: Unremarkable. No pancreatic ductal dilatation or surrounding inflammatory changes. Spleen: Normal in size without focal abnormality. Adrenals/Urinary Tract: Normal adrenal glands. No hydronephrosis and no suspicious renal lesions. Normal urinary bladder. Stomach/Bowel: Normal appearance of the stomach. Normal appendix. No bowel dilatation and no evidence for focal bowel inflammation. Vascular/Lymphatic: Patent abdominal aorta without aneurysm. Main visceral arteries are patent.  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 Again noted are mildly prominent retroperitoneal lymph nodes. Reproductive: Percutaneous drain has been placed in the left lower quadrant of the abdomen and traverses the left adnexal region. The pigtail is coiled posterior to the uterus. Previously there was a lobulated large fluid collection posterior to the uterus that measured 6.1 x 6.7 cm and this fluid collection has essentially resolved since drain placement. Residual fluid collection superior to the drain that roughly measures 3.8 x 5.6 x 2.6 cm, previously measured 4.7 x 6.1 x 2.9 cm. Again noted is a tubular fluid-filled structure in the left adnexa compatible with a dilated fallopian tube. Fluid collection along the posteroinferior aspect of the uterus measures 4.4 x 3.1 cm on image 69/2 and previously measured 3.6 x 1.9 cm. Poorly defined fluid around the right adnexa roughly measures 4.0 x 4.8 cm on image 61/2 and previously measured 4.6 x 4.5 cm. Small residual low-density fluid collection along the anterior aspect of the uterine body on image 86/8, largest fluid component measures 1.1 cm and may have slightly increased from the previous examination. Other: No significant fluid in the cul-de-sac. Negative for free air. Musculoskeletal: No acute bone abnormality. IMPRESSION: 1. Interval placement of a pelvic drain via the left lower quadrant of the abdomen. Largest fluid collection posterior to the uterus has resolved. At least 2 other adnexal collections have slightly decreased in size. Slight enlargement of 2 small fluid collections around the uterus. Findings compatible with partially treated tubo-ovarian abscess. 2. Duplicated IVC, incidental finding. 3. Left hydronephrosis has resolved since 10/03/2022. Electronically Signed   By: Markus Daft M.D.   On: 11/21/2022 14:44    Labs:  CBC: Recent Labs    08/07/22 1024  09/24/22 1633 10/10/22 1526 11/08/22 0837  WBC 13.1* 6.9 4.9 5.1  HGB 8.7* 7.4* 9.8* 10.6*  HCT 28.1* 24.4* 31.5* 34.4*  PLT 518* 664* 470* 333    COAGS: Recent Labs    11/08/22 0837  INR 1.0    BMP: Recent Labs    07/15/22 0622 07/17/22 0610 08/07/22 1024 09/24/22 1633 10/10/22 1526  NA 135 136 140 139 136  K 3.6 3.6 4.0 3.7 3.9  CL 102 101 104 100 103  CO2 18* 29 26 31 26   GLUCOSE 161* 119* 100* 85 100*  BUN 6 6 11 7 9   CALCIUM 8.7* 8.0* 9.2 9.1 9.6  CREATININE 1.06* 1.04* 1.11* 0.77 0.72  GFRNONAA >60 >60  --   --   --  LIVER FUNCTION TESTS: Recent Labs    07/15/22 0622 08/07/22 1024 09/24/22 1633 10/10/22 1526  BILITOT 1.5* 0.5 0.3 0.4  AST 49* 47* 18 11  ALT 84* 83* 19 9  ALKPHOS 59  --   --   --   PROT 7.5 8.4* 8.2* 8.2*  ALBUMIN 3.3*  --   --   --     Assessment:  Pelvic/Tubo-ovarian abscess s/p drain placement in IR 11/08/22 by Dr. Earleen Newport.   CT imaging shows the largest collection posterior to the uterus has resolved. Two other adnexal collections have slightly decreased in size but there is slight enlargement of two other small collections. Please see full dictation under the imaging tab in Epic.   Contrast injection under fluoroscopy demonstrates a moderately-sized residual abscess cavity. Decision made to leave the drain in place for now. We will bring the patient back on 12/04/22 for a repeat drain injection to further assess progress. She has a follow up appointment with Infectious Disease (Dr. Gale Journey) 12/05/22.   April Holland was instructed to begin flushing the drain with 5 ml NS every day. She was given enough flushes to last for a week or two and was told to call IR when she runs out. The drain will remain to suction. She knows she can call the clinic with any questions/concerns.   Signed: Theresa Duty, NP 11/21/2022, 3:50 PM   Please refer to Dr. Moises Blood attestation of this note for management and plan.

## 2022-11-22 ENCOUNTER — Other Ambulatory Visit: Payer: Self-pay | Admitting: Internal Medicine

## 2022-11-22 DIAGNOSIS — N7093 Salpingitis and oophoritis, unspecified: Secondary | ICD-10-CM

## 2022-11-22 NOTE — Progress Notes (Signed)
Thank you Adam.

## 2022-11-29 ENCOUNTER — Other Ambulatory Visit (HOSPITAL_COMMUNITY): Payer: Self-pay

## 2022-11-29 MED ORDER — NORMAL SALINE FLUSH 0.9 % IV SOLN
INTRAVENOUS | 0 refills | Status: DC
  Filled 2022-11-29: qty 100, 10d supply, fill #0

## 2022-12-05 ENCOUNTER — Ambulatory Visit
Admission: RE | Admit: 2022-12-05 | Discharge: 2022-12-05 | Disposition: A | Discharge status: Home / Self Care | Payer: No Typology Code available for payment source | Source: Ambulatory Visit | Attending: Internal Medicine | Admitting: Internal Medicine

## 2022-12-05 ENCOUNTER — Encounter: Payer: Self-pay | Admitting: Internal Medicine

## 2022-12-05 ENCOUNTER — Other Ambulatory Visit: Payer: Self-pay

## 2022-12-05 ENCOUNTER — Ambulatory Visit (INDEPENDENT_AMBULATORY_CARE_PROVIDER_SITE_OTHER): Payer: No Typology Code available for payment source | Admitting: Internal Medicine

## 2022-12-05 VITALS — BP 122/80 | HR 92 | Temp 98.5°F | Resp 16 | Ht 68.0 in | Wt 204.0 lb

## 2022-12-05 DIAGNOSIS — N7093 Salpingitis and oophoritis, unspecified: Secondary | ICD-10-CM

## 2022-12-05 DIAGNOSIS — K651 Peritoneal abscess: Secondary | ICD-10-CM | POA: Diagnosis not present

## 2022-12-05 MED ORDER — CEFADROXIL 500 MG PO CAPS: 1000.0000 mg | ORAL_CAPSULE | Freq: Two times a day (BID) | ORAL | 0 refills | Status: AC

## 2022-12-05 MED ORDER — METRONIDAZOLE 500 MG PO TABS: 500.0000 mg | ORAL_TABLET | Freq: Two times a day (BID) | ORAL | 0 refills | Status: AC

## 2022-12-05 MED ORDER — IOPAMIDOL (ISOVUE-300) INJECTION 61%
100.0000 mL | Freq: Once | INTRAVENOUS | Status: AC | PRN
  Administered 2022-12-05: 100 mL via INTRAVENOUS

## 2022-12-05 NOTE — Progress Notes (Signed)
[facility]  Patient Active Problem List   Diagnosis Date Noted   Screening for HIV (human immunodeficiency virus) 10/13/2022   Medication monitoring encounter 10/10/2022   Pelvic abscess in female 10/10/2022   Patellofemoral pain syndrome 09/06/2022   Abdominal pain with fever after surgery 07/16/2022   Tubo-ovarian abscess 07/15/2022   Positive ANA (antinuclear antibody) 02/28/2022   Menstrual disorder 02/27/2022   Family history of other endocrine, nutritional and metabolic diseases 0000000   Menorrhagia 02/27/2022   Female infertility 02/27/2022   Prediabetes 02/27/2022   Vitamin D deficiency 02/27/2022   Essential hypertension 09/17/2021   Goiter 01/11/2013      Subjective:    Patient ID: April Holland, female    DOB: August 25, 1986, 38 y.o.   MRN: [id number]  Chief Complaint  Patient presents with   Follow-up    Intra-abdominal abscess    HPI:  April Holland is a 37 y.o. female endometriosis here for hospital f/u TOA post ovarian sampling for preparation of fertility planning  She had a 5 cm abscess on ct scan Sepsis on presentation; negative bcx Negative std screen No sampling possible Started on iv bsAbx transitioned to amox-clav planned 6 weeks on discharge on 07/20/22  Reports ruq pain but no f/c and not present at this time. One episode about 1 week ago. No hx prior biliary stone. Wasn't triggered after a meal but after she woke up from nap. It hurts more when she takes a deep breath  No cough/short of breath No n/v/diarrhea/rash  Her stamina is improving but not back yet  She is still on hormone. Non-smoker  Asked about management of constipation. Taking advil not opioid  She has about 3 more weeks to go with antibiotics   ------------- 09/24/22 id f/u She is done with abx for about 4 weeks Back on her hormone suppresion No f/c Some discomfort left lower quadrant but has a lot of recent walking in [place] square [place] family. This has gotten better She feels a little bloated otherwise Appetite good Have had a fever the last week 101. Never had fever with hormone suppression therapy for endometriosis. Covid test was negative. No URI sx.  10/23/22 id clinic f/u Patient had repeat ct that showed recurrence of abscess Restarted on cefadroxil/flagyl Will have IR drain placement 1/19 No f/c/abd pain since 12/5 visit Asked if she has thrush (no sx but tonge appears white)  No n/v/diarrhea/rash    12/05/22 id f/u Patient has had 2 ir visits since last seen me; most recent today -- all drains removed See ct scan result from 2/1; 2/15 result still pending but essentially stable finding No side effect from abx Taking cefadroxil and metronidazole Has about 2 weeks left    Allergies  Allergen Reactions   Elemental Sulfur Rash   Sulfa Antibiotics Other (See Comments)   Sulfamethoxazole Rash      Outpatient Medications Prior to Visit  Medication Sig Dispense Refill   cefadroxil (DURICEF) 500 MG capsule Take 500 mg by mouth daily. Take 2 capsules by mouth 2 times daily.     letrozole (FEMARA) 2.5 MG tablet Take 2.5 mg by mouth daily.     metroNIDAZOLE (FLAGYL) 500 MG tablet Take 500 mg by mouth 2 (two) times daily.     Prenatal Vit-Fe Fumarate-FA (MULTIVITAMIN-PRENATAL) 27-0.8 MG TABS tablet Take 1 tablet by mouth daily at 12 noon.     aspirin EC 81 MG  tablet Take 81 mg by mouth daily. Swallow whole. (Patient not taking: Reported on 12/05/2022)     Boric Acid Vaginal 600 MG SUPP 600 mg (Patient not taking: Reported on 12/05/2022)     ferrous sulfate 325 (65 FE) MG EC tablet Take 325 mg by mouth 3 (three) times daily with meals. (Patient not taking: Reported on 12/05/2022)     fluconazole (DIFLUCAN) 150 MG tablet Take 150 mg by mouth once. (Patient not taking: Reported on 12/05/2022)     Ibuprofen-Acetaminophen (ADVIL DUAL ACTION) 125-250 MG TABS  (Patient not taking: Reported on 12/05/2022)      Multiple Vitamin (MULTIVITAMIN WITH MINERALS) TABS tablet Take 1 tablet by mouth daily. (Patient not taking: Reported on 12/05/2022)     norethindrone (AYGESTIN) 5 MG tablet Take 2.5 mg by mouth in the morning and at bedtime. (Patient not taking: Reported on 12/05/2022)     Sodium Chloride Flush (NORMAL SALINE FLUSH) 0.9 % SOLN FLush with 5 mls daily (Patient not taking: Reported on 12/05/2022) 100 mL 0   No facility-administered medications prior to visit.     Social History   Socioeconomic History   Marital status: Married    Spouse name: Not on file   Number of children: Not on file   Years of education: Not on file   Highest education level: Not on file  Occupational History   Not on file  Tobacco Use   Smoking status: Never    Passive exposure: Never   Smokeless tobacco: Never  Vaping Use   Vaping Use: Never used  Substance and Sexual Activity   Alcohol use: Not Currently    Comment: social   Drug use: No   Sexual activity: Yes  Other Topics Concern   Not on file  Social History Narrative   Not on file   Social Determinants of Health   Financial Resource Strain: Low Risk  (09/17/2021)   Overall Financial Resource Strain (CARDIA)    Difficulty of Paying Living Expenses: Not very hard  Food Insecurity: No Food Insecurity (07/15/2022)   Hunger Vital Sign    Worried About Running Out of Food in the Last Year: Never true    Ran Out of Food in the Last Year: Never true  Transportation Needs: No Transportation Needs (07/15/2022)   PRAPARE - Hydrologist (Medical): No    Lack of Transportation (Non-Medical): No  Physical Activity: Inactive (09/17/2021)   Exercise Vital Sign    Days of Exercise per Week: 0 days    Minutes of Exercise per Session: 0 min  Stress: Not on file  Social Connections: Not on file  Intimate Partner Violence: Not At Risk (07/15/2022)   Humiliation, Afraid, Rape, and Kick questionnaire    Fear of Current or  Ex-Partner: No    Emotionally Abused: No    Physically Abused: No    Sexually Abused: No      Review of Systems    All other ros negative   Objective:    BP 122/80   Pulse 92   Temp 98.5 F (36.9 C) (Oral)   Resp 16   Ht 5' 8"$  (1.727 m)   Wt 204 lb (92.5 kg)   LMP 10/27/2022 (Exact Date)   SpO2 100%   BMI 31.02 kg/m  Nursing note and vital signs reviewed.  Physical Exam     General/constitutional: no distress, pleasant HEENT: Normocephalic, PER, Conj Clear, EOMI, Oropharynx clear Neck supple CV: rrr no mrg Lungs:  clear to auscultation, normal respiratory effort Abd: Soft, Nontender -- no drain Ext: no edema Skin: No Rash Neuro: nonfocal MSK: no peripheral joint swelling/tenderness/warmth; back spines nontender        Labs: Lab Results  Component Value Date   WBC 5.1 11/08/2022   HGB 10.6 (L) 11/08/2022   HCT 34.4 (L) 11/08/2022   MCV 78.0 (L) 11/08/2022   PLT 333 [id number]   Last metabolic panel Lab Results  Component Value Date   GLUCOSE 100 (H) 10/10/2022   NA 136 10/10/2022   K 3.9 10/10/2022   CL 103 10/10/2022   CO2 26 10/10/2022   BUN 9 10/10/2022   CREATININE 0.72 10/10/2022   GFRNONAA >60 07/17/2022   CALCIUM 9.6 10/10/2022   PROT 8.2 (H) 10/10/2022   ALBUMIN 3.3 (L) 07/15/2022   BILITOT 0.4 10/10/2022   ALKPHOS 59 07/15/2022   AST 11 10/10/2022   ALT 9 10/10/2022   ANIONGAP 6 07/17/2022    Micro:  Serology:  Imaging: 9/25 ct abd pelv Findings are most consistent with pelvic inflammatory disease with tubo-ovarian abscess along the left posterior uterus, measuring up to 5.8 x 5.1 x 5.1 cm. Extensive stranding in the pelvis and small volume abdominopelvic ascites. A component of postprocedural change in the pelvis is possible, however, this would be a diagnosis of exclusion.  12/14 ct abd pelv 1. Increased size 8.8 x 7.3 cm tubo-ovarian abscess arising from the left adnexa. 2. Increased pelvic free fluid with pelvic  peritoneal hyperenhancement compatible with peritonitis. 3. New developing fluid collection in the right hemipelvis measures 3.7 x 1.2 cm. 4. New mild left hydronephrosis to the level of the walled-off fluid collections in the pelvis.   11/21/22 abd pelv ct 1. Interval placement of a pelvic drain via the left lower quadrant of the abdomen. Largest fluid collection posterior to the uterus has resolved. At least 2 other adnexal collections have slightly decreased in size. Slight enlargement of 2 small fluid collections around the uterus. Findings compatible with partially treated tubo-ovarian abscess. 2. Duplicated IVC, incidental finding. 3. Left hydronephrosis has resolved since 10/03/2022.   12/05/22 abd pelv ct Full report pending   Assessment & Plan:   Problem List Items Addressed This Visit   None   Abx: 12/06-c cefadroxil/flagyl  9/28-9/30 vanc/flagyl/ceftriaxone  9/30-c amox-clav   ASSESSMENT: TOA post ovarian sampling; 5 cm abscess on ct scan Sepsis resolved Endometriosis    Sepsis resolved as of 9/28. Clinically doing better 9/28 bcx while on abx ngtd If continued improvement by tomorrow, given negative std testing (and low risk), can discharge on amox-clav Planning 6 weeks abx on hand and will see in rcid clinic to determine further treatment course/evaluation   -------- 08/07/22 assessment Doing well Discussed 2 ways of going about the small abscess. 6 weeks and then monitor off abx vs repeat ct and see (which ct can lag behind tx success in terms of sterility of abscess and body self resorbing it)  Agree to do treatment and then observe off abx  -finish 3 more weeks abx -f/u 6-7 weeks -labs today  -advise to discuss with obgyn to see if any concern about dvt on aygestin given prior episode of ruq pain with some pleuritic quality (resolved now)  -------- 09/24/22 id assessment Off abx 4 weeks now Some discomfort left lower quadrant and an isolated  fever. Not sure what to make of this yet. Given these sx will get both labs and ct scan. Nontender today on exam  F/u 1 week after ct done; video visit ok F/u sooner if fever/chill persistent abd pain  ------------ addendum I called patient about extremely elevated crp 260 Given her discomfort and fever I would rather have her back on abx while waiting for the ct scan which could be another 2-3 weeks  She has lots of gi sx with amox-clav. I discuss cefadroxil/flagyl as another possibility. She is trying to get pregnant and I also spoke with obgyn on call for the day and flagyl would be ok to use.  60 days with 30 days 1 refill rx to [facility]  Patient acknowledged    10/23/22 assessment Pending ir drain placement 1/19 Doing well on cefadroxil flagyl no sign of sepsis or abd pain Advise culture during ir drain placement F/u 6 weeks No evidence of thrush as patient query  12/05/22 assessment All drains removed today Reviewed IR note Given relapse previously and some fluid density still there, will keep on abx for another 3-4 weeks 2 more weeks cefadroxil/flagyl rx given (she has 1-2 more weeks left) See me in about 6-7 weeks She asked to defer labs today and we can do in 6 weeks  I have spent a total of 40 minutes of face-to-face and non-face-to-face time, excluding clinical staff time, preparing to see patient, ordering tests and/or medications, and provide counseling the patient   Follow-up: Return in about 7 weeks (around 01/23/2023).      Jabier Mutton, [facility] 12/05/2022, 3:58 PM

## 2022-12-05 NOTE — Patient Instructions (Signed)
Let's do 3-4 more weeks abx   Another 2 weeks rx is given   See me in about 6-7 weeks

## 2022-12-05 NOTE — Progress Notes (Signed)
Referring Physician(s): Vu,Trung T  Chief Complaint: The patient is seen in follow up today s/p pelvic/tubo-ovarian abscess drain 11/08/22 by Dr Earleen Newport  History of present illness:  April Holland, 37 year old female with PMH significant for ovarian endometriosis, infertility and uterine adenomyosis causing frequent abnormal uterine bleeding. She underwent ovarian stimulation followed by a transvaginal ultrasound-guided bilateral ovarian follicle aspiration for in vitro fertilization on 07/02/22. She presented to the [facility] ED 07/15/22 with complaints of worsening abdominal pain and swelling. CT imaging showed pelvic inflammatory disease with a tubo-ovarian abscess along the left posterior uterus measuring 5.8 x 5.1 x 5.1 cm. Interventional Radiology was consulted and requested to evaluate patient for drain placement. Imaging was reviewed by Dr. Denna Haggard and the collection was deemed inaccessible via percutaneous approach. She received supportive care and conservative management with IV antibiotics and her symptoms gradually resolved. She was discharged home 07/20/22 with oral antibiotics. She followed up with GYN and Infectious Disease as an outpatient.    Her symptoms unfortunately returned in December 2023 and CT imaging 10/03/22 showed an increased size of the TOA to 8.8 x 7.3 cm. She was referred back to IR and on 11/08/22 a 12 Fr drain was placed to the LLQ by Dr. Earleen Newport. This procedure was done as an outpatient procedure.   She was seen in clinic on 11/21/22 for evaluation, where CT and drain injection revealed a resolution of the fluid pocket where drain was located, but revealed a communication with fluid pocket just above the drain.  Due to this, it was decided to keep drain in place.  She presents to the [facility] outpatient clinic today for drain evaluation with CT scan and drain injection. She reports following instructions to not flush her drain. Reports continued compliance  with PO antibiotics. She denies any pain, nausea, vomiting or diarrhea. She states she feels well and has no complaints. Reports 95m or less of thin yellow fluid daily.    Past Medical History:  Diagnosis Date   Essential hypertension 09/17/2021   Family history of other endocrine, nutritional and metabolic diseases 00000000  Hypertension    Prediabetes    Rupture of ovarian cyst     Past Surgical History:  Procedure Laterality Date   DILATATION & CURETTAGE/HYSTEROSCOPY WITH MYOSURE N/A 08/09/2020   Procedure: DILATATION & CURETTAGE/HYSTEROSCOPY WITH MYOSURE;  Surgeon: CChristophe Louis MD;  [place] [facility]  Service: Gynecology;  Laterality: N/A;   HYSTEROSCOPY  2022   WISDOM TOOTH EXTRACTION      Allergies: Elemental sulfur, Sulfa antibiotics, and Sulfamethoxazole  Medications: Prior to Admission medications   Medication Sig Start Date End Date Taking? Authorizing Provider  aspirin EC 81 MG tablet Take 81 mg by mouth daily. Swallow whole.    [provider]  Boric Acid Vaginal 600 MG SUPP 600 mg 07/16/18   [provider]  ferrous sulfate 325 (65 FE) MG EC tablet Take 325 mg by mouth 3 (three) times daily with meals.    [provider]  fluconazole (DIFLUCAN) 150 MG tablet Take 150 mg by mouth once.    [provider]  Ibuprofen-Acetaminophen (ADVIL DUAL ACTION) 125-250 MG TABS     [provider]  letrozole (FEMARA) 2.5 MG tablet Take 2.5 mg by mouth daily.    [provider]  Multiple Vitamin (MULTIVITAMIN WITH MINERALS) TABS tablet Take 1 tablet by mouth daily.    [provider]  norethindrone (AYGESTIN) 5 MG tablet Take 2.5 mg by  mouth in the morning and at bedtime.    [provider]  Prenatal Vit-Fe Fumarate-FA (MULTIVITAMIN-PRENATAL) 27-0.8 MG TABS tablet Take 1 tablet by mouth daily at 12 noon.    [provider]  Sodium Chloride Flush (NORMAL SALINE FLUSH) 0.9 % SOLN FLush  with 5 mls daily 11/29/22   Markus Daft, MD     Family History  Problem Relation Age of Onset   Thyroid disease Mother    Crohn's disease Mother    High Cholesterol Father    Hypertension Father    Stroke Maternal Grandmother    Heart failure Maternal Grandfather    Breast cancer Maternal Grandfather        in 23's   Diabetes Maternal Grandfather    Stroke Paternal Grandmother     Social History   Socioeconomic History   Marital status: Married    Spouse name: Not on file   Number of children: Not on file   Years of education: Not on file   Highest education level: Not on file  Occupational History   Not on file  Tobacco Use   Smoking status: Never    Passive exposure: Never   Smokeless tobacco: Never  Vaping Use   Vaping Use: Never used  Substance and Sexual Activity   Alcohol use: Not Currently    Comment: social   Drug use: No   Sexual activity: Yes  Other Topics Concern   Not on file  Social History Narrative   Not on file   Social Determinants of Health   Financial Resource Strain: Low Risk  (09/17/2021)   Overall Financial Resource Strain (CARDIA)    Difficulty of Paying Living Expenses: Not very hard  Food Insecurity: No Food Insecurity (07/15/2022)   Hunger Vital Sign    Worried About Running Out of Food in the Last Year: Never true    Ran Out of Food in the Last Year: Never true  Transportation Needs: No Transportation Needs (07/15/2022)   PRAPARE - Hydrologist (Medical): No    Lack of Transportation (Non-Medical): No  Physical Activity: Inactive (09/17/2021)   Exercise Vital Sign    Days of Exercise per Week: 0 days    Minutes of Exercise per Session: 0 min  Stress: Not on file  Social Connections: Not on file     Vital Signs: BP (!) 152/99 (BP Location: Left Arm)   Pulse 78   Temp 97.8 F (36.6 C) (Oral)   LMP 10/27/2022 (Exact Date)   SpO2 98%   Physical Exam Vitals reviewed.  Abdominal:     Comments: LLQ  drain in place. Suture and stat lock in place. Site is dressed appropriately. No erythema, induration, or other concern for infection. Site is non-tender, abdomen is non-tender. Scant amount of serous fluid in drain at time of exam.  Neurological:     Mental Status: She is alert and oriented to person, place, and time.  Psychiatric:        Mood and Affect: Mood normal.        Behavior: Behavior normal.        Thought Content: Thought content normal.        Judgment: Judgment normal.     Imaging: No results found.  Labs:  CBC: Recent Labs    08/07/22 1024 09/24/22 1633 10/10/22 1526 11/08/22 0837  WBC 13.1* 6.9 4.9 5.1  HGB 8.7* 7.4* 9.8* 10.6*  HCT 28.1* 24.4* 31.5* 34.4*  PLT 518*  664* 470* 333    COAGS: Recent Labs    11/08/22 0837  INR 1.0    BMP: Recent Labs    07/15/22 0622 07/17/22 0610 08/07/22 1024 09/24/22 1633 10/10/22 1526  NA 135 136 140 139 136  K 3.6 3.6 4.0 3.7 3.9  CL 102 101 104 100 103  CO2 18* 29 26 31 26  $ GLUCOSE 161* 119* 100* 85 100*  BUN 6 6 11 7 9  $ CALCIUM 8.7* 8.0* 9.2 9.1 9.6  CREATININE 1.06* 1.04* 1.11* 0.77 0.72  GFRNONAA >60 >60  --   --   --     LIVER FUNCTION TESTS: Recent Labs    07/15/22 0622 08/07/22 1024 09/24/22 1633 10/10/22 1526  BILITOT 1.5* 0.5 0.3 0.4  AST 49* 47* 18 11  ALT 84* 83* 19 9  ALKPHOS 59  --   --   --   PROT 7.5 8.4* 8.2* 8.2*  ALBUMIN 3.3*  --   --   --     Assessment:  #Pelvic/tubo-ovarian abscess s/p drain placement 11/08/22 by Dr Earleen Newport On last patient visit, CT demonstrated that the largest fluid collection posterior to the uterus had resolved, with slight decrease in size of two adnexal collections and slight enlargement of two other small collections. CT performed today showed no significant change from CT performed 11/21/22. Given that the patient has had minimal output, no fever/chills/abdominal pain, and essentially no change after noted resolution of abscess pocket that her drain was  in, the decision was made by Dr Pascal Lux to have the patient's drain removed today. Drain was removed without issue. Patient reports she has continued follow-up with Infectious Disease team, and she was advised to contact them if she develops any fever, chills, or significant abdominal pain. Patient voiced her understanding.  Please contact IR team with any questions or concerns.  Signed: Lura Em, PA-C 12/05/2022, 2:02 PM   Please refer to Dr. Pascal Lux' attestation of this note for management and plan.

## 2022-12-07 ENCOUNTER — Other Ambulatory Visit (HOSPITAL_COMMUNITY): Payer: Self-pay

## 2023-01-28 ENCOUNTER — Ambulatory Visit: Payer: No Typology Code available for payment source | Admitting: Internal Medicine

## 2023-02-03 ENCOUNTER — Ambulatory Visit
Admission: EM | Admit: 2023-02-03 | Discharge: 2023-02-03 | Disposition: A | Discharge status: Home / Self Care | Payer: No Typology Code available for payment source | Attending: Nurse Practitioner | Admitting: Nurse Practitioner

## 2023-02-03 DIAGNOSIS — R1030 Lower abdominal pain, unspecified: Secondary | ICD-10-CM | POA: Insufficient documentation

## 2023-02-03 LAB — POCT URINALYSIS DIP (MANUAL ENTRY)
Bilirubin, UA: NEGATIVE
Blood, UA: NEGATIVE
Glucose, UA: NEGATIVE mg/dL
Ketones, POC UA: NEGATIVE mg/dL
Leukocytes, UA: NEGATIVE
Nitrite, UA: NEGATIVE
Protein Ur, POC: NEGATIVE mg/dL
Spec Grav, UA: 1.025 (ref 1.010–1.025)
Urobilinogen, UA: 0.2 E.U./dL
pH, UA: 5.5 (ref 5.0–8.0)

## 2023-02-03 NOTE — Discharge Instructions (Addendum)
The urine sample today looks great-no signs of infection  We are testing the vaginal swab and will call you if anything comes back positive from this and prescribe treatment at that time  In the meantime, recommend following up with OB/GYN for ongoing lower abdominal/pelvic pain  Contact information is below for an OB/GYN in [place]-please give them a call as soon as you are able to to make an appointment  If you develop fever, nausea/vomiting and unable to keep fluids down, or severe pain, please go to the nearest emergency room immediately

## 2023-02-03 NOTE — ED Provider Notes (Signed)
[facility]    CSN: [id number] Arrival date & time: 02/03/23  1709      History   Chief Complaint Chief Complaint  Patient presents with   Abdominal Pain    HPI April Holland is a 37 y.o. female.   Patient presents today with lower abdominal pain for the past few days.  She reports the pain feels like "a really bad persistent cramp."  She reports the pain onset is sudden and severity is a 6 out of 10, describes pain as a cramping pain.  The pain lasts hours.  Reports she has been taking dual action Advil and tried a heating pad which does make the pain go away.  The pain does not radiate down either leg, however she is having some low back pain on both sides.  Reports the pain began prior to her most recent menses.  The patient denies any nausea, vomiting, decreased appetite, diarrhea, blood in her stool, heartburn or indigestion, urinary frequency or urgency, new rash, sores, or lesions on her genitalia, vaginal itching, vaginal discharge, hematuria, and vaginal odor.  She reports she has some cramping at the end of urination and with some bowel movements.  Reports history of BV and yeast infections and is wondering if she may have 1.  Also reports she was treated for UTI a couple of weeks ago.  Patient is not concerned about STI today and denies new sexual partners.  Patient reports a medical history significant for egg retrieval in September 2023.  Reports 2 weeks later, she went to the emergency room and had a ruptured cyst on the left side.  She ended up having to have a drain placed in her pelvis and was on antibiotics for months.  She followed closely with infectious disease until February when the drain was removed.  Reports around the same time, she was diagnosed with endometriosis.    Past Medical History:  Diagnosis Date   Essential hypertension 09/17/2021   Family history of other endocrine, nutritional and metabolic diseases 02/27/2022   Hypertension     Prediabetes    Rupture of ovarian cyst     Patient Active Problem List   Diagnosis Date Noted   Screening for HIV (human immunodeficiency virus) 10/13/2022   Medication monitoring encounter 10/10/2022   Pelvic abscess in female 10/10/2022   Patellofemoral pain syndrome 09/06/2022   Abdominal pain with fever after surgery 07/16/2022   Tubo-ovarian abscess 07/15/2022   Positive ANA (antinuclear antibody) 02/28/2022   Menstrual disorder 02/27/2022   Family history of other endocrine, nutritional and metabolic diseases 02/27/2022   Menorrhagia 02/27/2022   Female infertility 02/27/2022   Prediabetes 02/27/2022   Vitamin D deficiency 02/27/2022   Essential hypertension 09/17/2021   Goiter 01/11/2013    Past Surgical History:  Procedure Laterality Date   DILATATION & CURETTAGE/HYSTEROSCOPY WITH MYOSURE N/A 08/09/2020   Procedure: DILATATION & CURETTAGE/HYSTEROSCOPY WITH MYOSURE;  Surgeon: Gerald Leitz, MD;  Location: [facility];  Service: Gynecology;  Laterality: N/A;   HYSTEROSCOPY  2022   WISDOM TOOTH EXTRACTION      OB History   No obstetric history on file.      Home Medications    Prior to Admission medications   Medication Sig Start Date End Date Taking? Authorizing Provider  Ibuprofen-Acetaminophen (ADVIL DUAL ACTION) 125-250 MG TABS    Yes [provider]  aspirin EC 81 MG tablet Take 81 mg by mouth daily. Swallow whole. Patient not taking: Reported on  12/05/2022    [provider]  Boric Acid Vaginal 600 MG SUPP 600 mg Patient not taking: Reported on 12/05/2022 07/16/18   [provider]  ferrous sulfate 325 (65 FE) MG EC tablet Take 325 mg by mouth 3 (three) times daily with meals. Patient not taking: Reported on 12/05/2022    [provider]  fluconazole (DIFLUCAN) 150 MG tablet Take 150 mg by mouth once. Patient not taking: Reported on 12/05/2022    [provider]  letrozole [facility]) 2.5 MG tablet Take 2.5  mg by mouth daily.    [provider]  Multiple Vitamin (MULTIVITAMIN WITH MINERALS) TABS tablet Take 1 tablet by mouth daily. Patient not taking: Reported on 12/05/2022    [provider]  norethindrone (AYGESTIN) 5 MG tablet Take 2.5 mg by mouth in the morning and at bedtime. Patient not taking: Reported on 12/05/2022    [provider]  Prenatal Vit-Fe Fumarate-FA (MULTIVITAMIN-PRENATAL) 27-0.8 MG TABS tablet Take 1 tablet by mouth daily at 12 noon.    [provider]  Sodium Chloride Flush (NORMAL SALINE FLUSH) 0.9 % SOLN FLush with 5 mls daily Patient not taking: Reported on 12/05/2022 11/29/22   Richarda Overlie, MD    Family History Family History  Problem Relation Age of Onset   Thyroid disease Mother    Crohn's disease Mother    High Cholesterol Father    Hypertension Father    Stroke Maternal Grandmother    Heart failure Maternal Grandfather    Breast cancer Maternal Grandfather        in 56's   Diabetes Maternal Grandfather    Stroke Paternal Grandmother     Social History Social History   Tobacco Use   Smoking status: Never    Passive exposure: Never   Smokeless tobacco: Never  Vaping Use   Vaping Use: Never used  Substance Use Topics   Alcohol use: Not Currently    Comment: social   Drug use: No     Allergies   Elemental sulfur, Sulfa antibiotics, and Sulfamethoxazole   Review of Systems Review of Systems Per HPI  Physical Exam Triage Vital Signs ED Triage Vitals  Enc Vitals Group     BP 02/03/23 1729 (!) 141/88     Pulse Rate 02/03/23 1729 93     Resp 02/03/23 1729 17     Temp 02/03/23 1729 98 F (36.7 C)     Temp Source 02/03/23 1729 Oral     SpO2 02/03/23 1729 95 %     Weight --      Height --      Head Circumference --      Peak Flow --      Pain Score 02/03/23 1730 6     Pain Loc --      Pain Edu? --      Excl. in GC? --    No data found.  Updated Vital Signs BP (!) 141/88 (BP Location: Right Arm)    Pulse 93   Temp 98 F (36.7 C) (Oral)   Resp 17   LMP 01/24/2023 (Exact Date)   SpO2 95%   Visual Acuity Right Eye Distance:   Left Eye Distance:   Bilateral Distance:    Right Eye Near:   Left Eye Near:    Bilateral Near:     Physical Exam Vitals and nursing note reviewed.  Constitutional:      General: She is not in acute distress.    Appearance:  Normal appearance. She is not toxic-appearing.  HENT:     Head: Normocephalic and atraumatic.     Mouth/Throat:     Mouth: Mucous membranes are moist.     Pharynx: Oropharynx is clear.  Eyes:     General: No scleral icterus.    Extraocular Movements: Extraocular movements intact.  Cardiovascular:     Rate and Rhythm: Regular rhythm. Tachycardia present.  Pulmonary:     Effort: Pulmonary effort is normal. No respiratory distress.     Breath sounds: Normal breath sounds. No wheezing, rhonchi or rales.  Abdominal:     General: Abdomen is flat. Bowel sounds are normal. There is no distension.     Palpations: Abdomen is soft.     Tenderness: There is abdominal tenderness in the suprapubic area and left lower quadrant. There is no right CVA tenderness, left CVA tenderness, guarding or rebound. Negative signs include Murphy's sign, Rovsing's sign and McBurney's sign.  Musculoskeletal:     Cervical back: Normal range of motion.  Lymphadenopathy:     Cervical: No cervical adenopathy.  Skin:    General: Skin is warm and dry.     Capillary Refill: Capillary refill takes less than 2 seconds.     Coloration: Skin is not jaundiced or pale.     Findings: No erythema.  Neurological:     Mental Status: She is alert and oriented to person, place, and time.  Psychiatric:        Mood and Affect: Mood is not anxious or depressed.        Behavior: Behavior is cooperative.      UC Treatments / Results  Labs (all labs ordered are listed, but only abnormal results are displayed) Labs Reviewed  POCT URINALYSIS DIP (MANUAL ENTRY)   CERVICOVAGINAL ANCILLARY ONLY    EKG   Radiology No results found.  Procedures Procedures (including critical care time)  Medications Ordered in UC Medications - No data to display  Initial Impression / Assessment and Plan / UC Course  I have reviewed the triage vital signs and the nursing notes.  Pertinent labs & imaging results that were available during my care of the patient were reviewed by me and considered in my medical decision making (see chart for details).   Patient is well-appearing, normotensive, afebrile, not tachycardic, not tachypneic, oxygenating well on room air.    1. Lower abdominal pain Urinalysis today is negative for signs of infection Cytology swab is pending Difficult to ascertain causes patient has significant pelvic history including endometriosis and history of ruptured cyst Supportive care discussed to include continuing Advil dual strength as well as warm compresses Recommended following up with OB/GYN and contact information given Strict ER precautions discussed with patient  The patient was given the opportunity to ask questions.  All questions answered to their satisfaction.  The patient is in agreement to this plan.    Final Clinical Impressions(s) / UC Diagnoses   Final diagnoses:  Lower abdominal pain     Discharge Instructions      The urine sample today looks great-no signs of infection  We are testing the vaginal swab and will call you if anything comes back positive from this and prescribe treatment at that time  In the meantime, recommend following up with OB/GYN for ongoing lower abdominal/pelvic pain  Contact information is below for an OB/GYN in [place]-please give them a call as soon as you are able to to make an appointment  If you develop fever, nausea/vomiting  and unable to keep fluids down, or severe pain, please go to the nearest emergency room immediately     ED Prescriptions   None    PDMP not reviewed  this encounter.   Valentino Nose, NP 02/03/23 [phone]

## 2023-02-03 NOTE — ED Triage Notes (Signed)
Left lower abdominal pain, has a history of ruptured cyst with drain in February. Now having lower back pain, with cramping in lower abdomin/pelvis that started 3 weeks ago. Did get tested for uti a week and half ago and was negative, but still having symptoms.

## 2023-02-04 LAB — CERVICOVAGINAL ANCILLARY ONLY
Bacterial Vaginitis (gardnerella): POSITIVE — AB
Candida Glabrata: NEGATIVE
Candida Vaginitis: NEGATIVE
Chlamydia: NEGATIVE
Comment: NEGATIVE
Comment: NEGATIVE
Comment: NEGATIVE
Comment: NEGATIVE
Comment: NEGATIVE
Comment: NORMAL
Neisseria Gonorrhea: NEGATIVE
Trichomonas: NEGATIVE

## 2023-02-05 ENCOUNTER — Telehealth (HOSPITAL_COMMUNITY): Payer: Self-pay | Admitting: Emergency Medicine

## 2023-02-05 MED ORDER — METRONIDAZOLE 500 MG PO TABS: 500.0000 mg | ORAL_TABLET | Freq: Two times a day (BID) | ORAL | 0 refills | Status: DC

## 2023-02-19 ENCOUNTER — Other Ambulatory Visit: Payer: Self-pay

## 2023-02-19 ENCOUNTER — Emergency Department (HOSPITAL_COMMUNITY): Payer: No Typology Code available for payment source

## 2023-02-19 ENCOUNTER — Emergency Department (HOSPITAL_COMMUNITY)
Admission: EM | Admit: 2023-02-19 | Discharge: 2023-02-19 | Disposition: A | Discharge status: Home / Self Care | Payer: No Typology Code available for payment source | Attending: Emergency Medicine | Admitting: Emergency Medicine

## 2023-02-19 DIAGNOSIS — N719 Inflammatory disease of uterus, unspecified: Secondary | ICD-10-CM

## 2023-02-19 DIAGNOSIS — D649 Anemia, unspecified: Secondary | ICD-10-CM | POA: Insufficient documentation

## 2023-02-19 DIAGNOSIS — N739 Female pelvic inflammatory disease, unspecified: Secondary | ICD-10-CM | POA: Diagnosis not present

## 2023-02-19 DIAGNOSIS — R102 Pelvic and perineal pain: Secondary | ICD-10-CM

## 2023-02-19 DIAGNOSIS — I10 Essential (primary) hypertension: Secondary | ICD-10-CM | POA: Insufficient documentation

## 2023-02-19 LAB — COMPREHENSIVE METABOLIC PANEL
ALT: 22 U/L (ref 0–44)
AST: 17 U/L (ref 15–41)
Albumin: 3.7 g/dL (ref 3.5–5.0)
Alkaline Phosphatase: 45 U/L (ref 38–126)
Anion gap: 10 (ref 5–15)
BUN: 11 mg/dL (ref 6–20)
CO2: 22 mmol/L (ref 22–32)
Calcium: 8.9 mg/dL (ref 8.9–10.3)
Chloride: 104 mmol/L (ref 98–111)
Creatinine, Ser: 0.77 mg/dL (ref 0.44–1.00)
GFR, Estimated: >60 mL/min (ref >60)
Glucose, Bld: 113 mg/dL — ABNORMAL HIGH (ref 70–99)
Potassium: 3.3 mmol/L — ABNORMAL LOW (ref 3.5–5.1)
Sodium: 136 mmol/L (ref 135–145)
Total Bilirubin: 0.5 mg/dL (ref 0.3–1.2)
Total Protein: 7.1 g/dL (ref 6.5–8.1)

## 2023-02-19 LAB — CBC WITH DIFFERENTIAL/PLATELET
Abs Immature Granulocytes: 0.02 10*3/uL (ref 0.00–0.07)
Basophils Absolute: 0 10*3/uL (ref 0.0–0.1)
Basophils Relative: 0 %
Eosinophils Absolute: 0.1 10*3/uL (ref 0.0–0.5)
Eosinophils Relative: 1 %
HCT: 35.9 % — ABNORMAL LOW (ref 36.0–46.0)
Hemoglobin: 11.7 g/dL — ABNORMAL LOW (ref 12.0–15.0)
Immature Granulocytes: 0 %
Lymphocytes Relative: 41 %
Lymphs Abs: 3 10*3/uL (ref 0.7–4.0)
MCH: 26.2 pg (ref 26.0–34.0)
MCHC: 32.6 g/dL (ref 30.0–36.0)
MCV: 80.3 fL (ref 80.0–100.0)
Monocytes Absolute: 0.5 10*3/uL (ref 0.1–1.0)
Monocytes Relative: 6 %
Neutro Abs: 3.8 10*3/uL (ref 1.7–7.7)
Neutrophils Relative %: 52 %
Platelets: 289 10*3/uL (ref 150–400)
RBC: 4.47 MIL/uL (ref 3.87–5.11)
RDW: 15.9 % — ABNORMAL HIGH (ref 11.5–15.5)
WBC: 7.4 10*3/uL (ref 4.0–10.5)
nRBC: 0 % (ref 0.0–0.2)

## 2023-02-19 LAB — GC/CHLAMYDIA PROBE AMP (~~LOC~~) NOT AT ARMC
Chlamydia: NEGATIVE
Comment: NEGATIVE
Comment: NORMAL
Neisseria Gonorrhea: NEGATIVE

## 2023-02-19 LAB — URINALYSIS, ROUTINE W REFLEX MICROSCOPIC
Bilirubin Urine: NEGATIVE
Glucose, UA: NEGATIVE mg/dL
Ketones, ur: NEGATIVE mg/dL
Leukocytes,Ua: NEGATIVE
Nitrite: NEGATIVE
Protein, ur: 30 mg/dL — AB
Specific Gravity, Urine: 1.03 (ref 1.005–1.030)
pH: 5 (ref 5.0–8.0)

## 2023-02-19 LAB — WET PREP, GENITAL
Clue Cells Wet Prep HPF POC: NONE SEEN
Sperm: NONE SEEN
Trich, Wet Prep: NONE SEEN
WBC, Wet Prep HPF POC: <10 (ref <10)
Yeast Wet Prep HPF POC: NONE SEEN

## 2023-02-19 LAB — I-STAT BETA HCG BLOOD, ED (MC, WL, AP ONLY): I-stat hCG, quantitative: <5 m[IU]/mL (ref <5)

## 2023-02-19 MED ORDER — OXYCODONE-ACETAMINOPHEN 5-325 MG PO TABS: 1.0000 | ORAL_TABLET | Freq: Four times a day (QID) | ORAL | 0 refills | Status: DC | PRN

## 2023-02-19 MED ORDER — ONDANSETRON HCL 4 MG/2ML IJ SOLN
4.0000 mg | Freq: Once | INTRAMUSCULAR | Status: AC
  Administered 2023-02-19: 4 mg via INTRAVENOUS
  Filled 2023-02-19: qty 2

## 2023-02-19 MED ORDER — FENTANYL CITRATE PF 50 MCG/ML IJ SOSY
100.0000 ug | PREFILLED_SYRINGE | Freq: Once | INTRAMUSCULAR | Status: AC
  Administered 2023-02-19: 100 ug via INTRAVENOUS
  Filled 2023-02-19: qty 2

## 2023-02-19 MED ORDER — METRONIDAZOLE 500 MG PO TABS: 500.0000 mg | ORAL_TABLET | Freq: Two times a day (BID) | ORAL | 0 refills | Status: AC

## 2023-02-19 MED ORDER — OXYCODONE-ACETAMINOPHEN 5-325 MG PO TABS
1.0000 | ORAL_TABLET | Freq: Once | ORAL | Status: AC
  Administered 2023-02-19: 1 via ORAL
  Filled 2023-02-19: qty 1

## 2023-02-19 MED ORDER — IOHEXOL 350 MG/ML SOLN
75.0000 mL | Freq: Once | INTRAVENOUS | Status: AC | PRN
  Administered 2023-02-19: 75 mL via INTRAVENOUS

## 2023-02-19 MED ORDER — CEFADROXIL 500 MG PO CAPS: 1000.0000 mg | ORAL_CAPSULE | Freq: Two times a day (BID) | ORAL | 0 refills | Status: AC

## 2023-02-19 NOTE — ED Provider Notes (Signed)
  Physical Exam  BP (!) 154/102 (BP Location: Right Arm)   Pulse 93   Temp 98.3 F (36.8 C)   Resp 18   LMP 01/24/2023 (Exact Date)   SpO2 98%   Physical Exam  Procedures  Procedures  ED Course / MDM   Clinical Course as of 02/19/23 0826  Wed Feb 19, 2023  0604 Patient with previous pelvic/tubo-ovarian abscess after ovarian sampling that required antibiotics and external drain.  Patient reports the drain was removed in February.  She was recently on Flagyl for BV.  She now reports increasing abdominal and pelvic pain as well as vaginal spotting.  Plan for pelvic exam, will proceed with CT imaging due to complicated history. [DW]  1610 CT findings noted, will consult her OBGYN Dr Cherly Hensen.  Signed out to dr Daveena Elmore at shift change [DW]    Clinical Course User Index [DW] Zadie Rhine, MD   Medical Decision Making Amount and/or Complexity of Data Reviewed Labs: ordered. Radiology: ordered.  Risk Prescription drug management.   Received in signout.  Abdominal pain.  Potential uterine abscess.  No discharge.  Discussed with Dr. Cherly Hensen will see in follow-up if needed.  I also discussed with infectious disease.  Discussed with Dr. Kingsley Spittle and we discussed antibiotics.  Augmentin doxycycline versus continuing on the same medicine she was on previously.  However patient did not tolerate Augmentin well.  Will start on same dose of Duricef and Flagyl.  Follow-up with Dr. Leanna Sato, MD 02/19/23 [phone]

## 2023-02-19 NOTE — Discharge Instructions (Signed)
Follow-up with infectious disease.  Return for worsening symptoms.

## 2023-02-19 NOTE — ED Provider Notes (Signed)
[place] EMERGENCY DEPARTMENT AT [facility] Provider Note   CSN: [id number] Arrival date & time: 02/19/23  0422     History  Chief Complaint  Patient presents with   Abdominal Pain    Pelvic Pain     April Holland is a 37 y.o. female.  The history is provided by the patient.  Patient with history of hypertension, previous ruptured ovarian cyst, previous pelvic tubo-ovarian abscess that required drain and antibiotics.  She reports over the past several weeks has had increasing lower pelvic pain.  She is also having some vaginal spotting.  No fevers, no chills, no vomiting.  She will have loose stool at times.   Pt reports she is just starting her menstrual cycle Past Medical History:  Diagnosis Date   Essential hypertension 09/17/2021   Family history of other endocrine, nutritional and metabolic diseases 02/27/2022   Hypertension    Prediabetes    Rupture of ovarian cyst     Home Medications Prior to Admission medications   Medication Sig Start Date End Date Taking? Authorizing Provider  Boric Acid Vaginal 600 MG SUPP 600 mg Patient not taking: Reported on 12/05/2022 07/16/18   [provider]  Ibuprofen-Acetaminophen (ADVIL DUAL ACTION) 125-250 MG TABS     [provider]  Multiple Vitamin (MULTIVITAMIN WITH MINERALS) TABS tablet Take 1 tablet by mouth daily. Patient not taking: Reported on 12/05/2022    [provider]  Prenatal Vit-Fe Fumarate-FA (MULTIVITAMIN-PRENATAL) 27-0.8 MG TABS tablet Take 1 tablet by mouth daily at 12 noon.    [provider]  Sodium Chloride Flush (NORMAL SALINE FLUSH) 0.9 % SOLN FLush with 5 mls daily Patient not taking: Reported on 12/05/2022 11/29/22   Richarda Overlie, MD      Allergies    Elemental sulfur, Sulfa antibiotics, and Sulfamethoxazole    Review of Systems   Review of Systems  Constitutional:  Negative for chills and fever.  Gastrointestinal:  Positive for abdominal pain. Negative for  vomiting.  Genitourinary:  Positive for vaginal bleeding and vaginal discharge. Negative for dyspareunia.    Physical Exam Updated Vital Signs BP (!) 154/102 (BP Location: Right Arm)   Pulse 93   Temp 98.3 F (36.8 C)   Resp 18   LMP 01/24/2023 (Exact Date)   SpO2 98%  Physical Exam CONSTITUTIONAL: Well developed/well nourished HEAD: Normocephalic/atraumatic NECK: supple no meningeal signs CV: S1/S2 noted, no murmurs/rubs/gallops noted LUNGS: Lungs are clear to auscultation bilaterally, no apparent distress ABDOMEN: soft, mild left lower quadrant and right lower quadrant abdominal tenderness, no rebound or guarding, bowel sounds noted throughout abdomen GU:no cva tenderness Chaperoned by female nurse Shannen Blood noted in the vaginal vault.  No significant CMT, no significant adnexal tenderness NEURO: Pt is awake/alert/appropriate, moves all extremitiesx4.   EXTREMITIES: full ROM SKIN: warm, color normal PSYCH: no abnormalities of mood noted, alert and oriented to situation  ED Results / Procedures / Treatments   Labs (all labs ordered are listed, but only abnormal results are displayed) Labs Reviewed  CBC WITH DIFFERENTIAL/PLATELET - Abnormal; Notable for the following components:      Result Value   Hemoglobin 11.7 (*)    HCT 35.9 (*)    RDW 15.9 (*)    All other components within normal limits  COMPREHENSIVE METABOLIC PANEL - Abnormal; Notable for the following components:   Potassium 3.3 (*)    Glucose, Bld 113 (*)    All other components within normal limits  URINALYSIS, ROUTINE  W REFLEX MICROSCOPIC - Abnormal; Notable for the following components:   APPearance HAZY (*)    Hgb urine dipstick LARGE (*)    Protein, ur 30 (*)    Bacteria, UA RARE (*)    All other components within normal limits  WET PREP, GENITAL  I-STAT BETA HCG BLOOD, ED ([facility], [facility], [facility] ONLY)  GC/CHLAMYDIA PROBE AMP ([place]) NOT AT [facility]    EKG None  Radiology CT ABDOMEN PELVIS W  CONTRAST  Result Date: 02/19/2023 CLINICAL DATA:  37 year old female with history of pelvic pain. EXAM: CT ABDOMEN AND PELVIS WITH CONTRAST TECHNIQUE: Multidetector CT imaging of the abdomen and pelvis was performed using the standard protocol following bolus administration of intravenous contrast. RADIATION DOSE REDUCTION: This exam was performed according to the departmental dose-optimization program which includes automated exposure control, adjustment of the mA and/or kV according to patient size and/or use of iterative reconstruction technique. CONTRAST:  75mL OMNIPAQUE IOHEXOL 350 MG/ML SOLN COMPARISON:  CT of the abdomen and pelvis 12/05/2022. FINDINGS: Lower chest: Unremarkable. Hepatobiliary: No suspicious cystic or solid hepatic lesions. No intra or extrahepatic biliary ductal dilatation. Gallbladder is unremarkable in appearance. Pancreas: No pancreatic mass. No pancreatic ductal dilatation. No pancreatic or peripancreatic fluid collections or inflammatory changes. Spleen: Unremarkable. Adrenals/Urinary Tract: Bilateral kidneys and adrenal glands are normal in appearance. No hydroureteronephrosis. Urinary bladder is unremarkable in appearance. Stomach/Bowel: The appearance of the stomach is normal. No pathologic dilatation of small bowel or colon. Normal appendix. Vascular/Lymphatic: No significant atherosclerotic disease, aneurysm or dissection noted in the abdominal or pelvic vasculature. No lymphadenopathy noted in the abdomen or pelvis. Reproductive: Previously noted dilated left fallopian tube is slightly less pronounced than the prior examination, but remains dilated, which could reflect residual hydrosalpinx and/or residual pyosalpinx. Uterus is remarkable for multiple low-attenuation areas that are more prominent than the prior study. The largest of these is located along the anterior aspect of the uterine body best appreciated on axial image 74 of series 3 and sagittal image 65 of series 7  measuring 2.6 x 2.3 x 3.1 cm, suspicious for a small abscess. There may be a fistulous tract extending from this abscess along the anterior surface of the uterus. In the posterior aspect of the high uterine body and fundus (axial image 65 of series 3 and sagittal image 60 of series 7) there is an additional lesion measuring 2.0 x 1.6 x 1.4 cm, suspicious for an additional abscess. Right ovary is unremarkable in appearance. Other: Previously noted pigtail drainage catheter in the left hemipelvis has been removed. Trace volume of free fluid in the low anatomic pelvis. No larger volume of ascites. No pneumoperitoneum. Musculoskeletal: There are no aggressive appearing lytic or blastic lesions noted in the visualized portions of the skeleton. IMPRESSION: 1. Previously noted left-sided pigtail drainage catheter has been removed. Left fallopian tube remains mildly dilated, although less so than the prior study, which may suggest residual pyosalpinx and/or hydrosalpinx. Importantly, however, there are increasingly prominent low-attenuation regions in the uterus, as detailed above, suspicious for myometrial abscesses. The largest of these anteriorly may have a fistulous tract extending along the subserosal aspect of the uterus. 2. Trace volume of ascites. Electronically Signed   By: Trudie Reed M.D.   On: 02/19/2023 07:01    Procedures Procedures    Medications Ordered in ED Medications  fentaNYL (SUBLIMAZE) injection 100 mcg (100 mcg Intravenous Given 02/19/23 0609)  ondansetron (ZOFRAN) injection 4 mg (4 mg Intravenous Given 02/19/23 0625)  iohexol (OMNIPAQUE)  350 MG/ML injection 75 mL (75 mLs Intravenous Contrast Given 02/19/23 0641)    ED Course/ Medical Decision Making/ A&P Clinical Course as of 02/19/23 0721  Wed Feb 19, 2023  0604 Patient with previous pelvic/tubo-ovarian abscess after ovarian sampling that required antibiotics and external drain.  Patient reports the drain was removed in February.   She was recently on Flagyl for BV.  She now reports increasing abdominal and pelvic pain as well as vaginal spotting.  Plan for pelvic exam, will proceed with CT imaging due to complicated history. [DW]  5284 CT findings noted, will consult her OBGYN Dr Cherly Hensen.  Signed out to dr pickering at shift change [DW]    Clinical Course User Index [DW] Zadie Rhine, MD                             Medical Decision Making Amount and/or Complexity of Data Reviewed Labs: ordered. Radiology: ordered.  Risk Prescription drug management.   This patient presents to the ED for concern of abdominal pain and pelvic pain, this involves an extensive number of treatment options, and is a complaint that carries with it a high risk of complications and morbidity.  The differential diagnosis includes but is not limited to cholecystitis, cholelithiasis, pancreatitis, gastritis, peptic ulcer disease, appendicitis, bowel obstruction, bowel perforation, diverticulitis, ischemic bowel, pelvic abscess, ectopic pregnancy    Comorbidities that complicate the patient evaluation: Patient's presentation is complicated by their history of hypertension and previous pelvic abscess   Additional history obtained: Records reviewed  infectious disease notes  Lab Tests: I Ordered, and personally interpreted labs.  The pertinent results include: Mild anemia  Imaging Studies ordered: I ordered imaging studies including CT scan abdomen pelvis   I independently visualized and interpreted imaging which showed uterine abscess I agree with the radiologist interpretation   Medicines ordered and prescription drug management: I ordered medication including fentanyl for pain Reevaluation of the patient after these medicines showed that the patient    improved   After the interventions noted above, I reevaluated the patient and found that they have :improved  Complexity of problems addressed: Patient's presentation is most  consistent with  acute presentation with potential threat to life or bodily function         Final Clinical Impression(s) / ED Diagnoses Final diagnoses:  Pelvic pain    Rx / DC Orders ED Discharge Orders     None         Zadie Rhine, MD 02/19/23 [phone]

## 2023-02-19 NOTE — ED Triage Notes (Signed)
Patient reports hypogastric / pelvic pain radiating to lower back for several weeks , mild dysuria , denies emesis or diarrhea , no fever or chills .

## 2023-02-26 ENCOUNTER — Encounter: Payer: Self-pay | Admitting: Internal Medicine

## 2023-02-26 ENCOUNTER — Ambulatory Visit (INDEPENDENT_AMBULATORY_CARE_PROVIDER_SITE_OTHER): Payer: No Typology Code available for payment source | Admitting: Internal Medicine

## 2023-02-26 ENCOUNTER — Other Ambulatory Visit: Payer: Self-pay

## 2023-02-26 VITALS — BP 140/89 | HR 88 | Resp 16 | Ht 68.0 in | Wt 207.0 lb

## 2023-02-26 DIAGNOSIS — N719 Inflammatory disease of uterus, unspecified: Secondary | ICD-10-CM

## 2023-02-26 DIAGNOSIS — N739 Female pelvic inflammatory disease, unspecified: Secondary | ICD-10-CM | POA: Diagnosis not present

## 2023-02-26 NOTE — Progress Notes (Signed)
[facility]  Patient Active Problem List   Diagnosis Date Noted   Screening for HIV (human immunodeficiency virus) 10/13/2022   Medication monitoring encounter 10/10/2022   Pelvic abscess in female 10/10/2022   Patellofemoral pain syndrome 09/06/2022   Abdominal pain with fever after surgery 07/16/2022   Tubo-ovarian abscess 07/15/2022   Positive ANA (antinuclear antibody) 02/28/2022   Menstrual disorder 02/27/2022   Family history of other endocrine, nutritional and metabolic diseases 02/27/2022   Menorrhagia 02/27/2022   Female infertility 02/27/2022   Prediabetes 02/27/2022   Vitamin D deficiency 02/27/2022   Essential hypertension 09/17/2021   Goiter 01/11/2013      Subjective:    Patient ID: April Holland, female    DOB: 12/28/1985, 37 y.o.   MRN: [id number]  Chief Complaint  Patient presents with   Follow-up    HPI:  April Holland is a 37 y.o. female endometriosis here for hospital f/u TOA post ovarian sampling for preparation of fertility planning  She had a 5 cm abscess on ct scan Sepsis on presentation; negative bcx Negative std screen No sampling possible Started on iv bsAbx transitioned to amox-clav planned 6 weeks on discharge on 07/20/22  Reports ruq pain but no f/c and not present at this time. One episode about 1 week ago. No hx prior biliary stone. Wasn't triggered after a meal but after she woke up from nap. It hurts more when she takes a deep breath  No cough/short of breath No n/v/diarrhea/rash  Her stamina is improving but not back yet  She is still on hormone. Non-smoker  Asked about management of constipation. Taking advil not opioid  She has about 3 more weeks to go with antibiotics   ------------- 09/24/22 id f/u She is done with abx for about 4 weeks Back on her hormone suppresion No f/c Some discomfort left lower quadrant but has a lot of recent walking in [place].  This has gotten better She feels a little bloated otherwise Appetite good Have had a fever the last week 101. Never had fever with hormone suppression therapy for endometriosis. Covid test was negative. No URI sx.  10/23/22 id clinic f/u Patient had repeat ct that showed recurrence of abscess Restarted on cefadroxil/flagyl Will have IR drain placement 1/19 No f/c/abd pain since 12/5 visit Asked if she has thrush (no sx but tonge appears white)  No n/v/diarrhea/rash    12/05/22 id f/u Patient has had 2 ir visits since last seen me; most recent today -- all drains removed See ct scan result from 2/1; 2/15 result still pending but essentially stable finding No side effect from abx Taking cefadroxil and metronidazole Has about 2 weeks left     02/26/23 id clinic f/u Patient had abd pain and went to ed 02/19/23 abd pelv ct showed myometrial abscess She was started on flagyl/cefadroxil again No pain Only taking 500 mg cefadroxil bid though No n/v/diarrhea No f/c Seeing gyne in a few weeks  Allergies  Allergen Reactions   Elemental Sulfur Rash   Sulfa Antibiotics Other (See Comments)   Sulfamethoxazole Rash      Outpatient Medications Prior to Visit  Medication Sig Dispense Refill   Boric Acid Vaginal 600 MG SUPP Place 600 mg vaginally daily as needed (as needed).     cefadroxil (DURICEF) 500 MG capsule Take 2 capsules (1,000 mg total) by mouth 2 (two) times daily for 14 days. 56  capsule 0   Ibuprofen-Acetaminophen (ADVIL DUAL ACTION) 125-250 MG TABS Take 125-250 mg by mouth daily.     metroNIDAZOLE (FLAGYL) 500 MG tablet Take 1 tablet (500 mg total) by mouth 2 (two) times daily for 14 days. 28 tablet 0   oxyCODONE-acetaminophen (PERCOCET/ROXICET) 5-325 MG tablet Take 1 tablet by mouth every 6 (six) hours as needed for severe pain. 8 tablet 0   Sodium Chloride Flush (NORMAL SALINE FLUSH) 0.9 % SOLN FLush with 5 mls daily (Patient not taking: Reported on 12/05/2022) 100 mL 0   No  facility-administered medications prior to visit.     Social History   Socioeconomic History   Marital status: Unknown    Spouse name: Not on file   Number of children: Not on file   Years of education: Not on file   Highest education level: Not on file  Occupational History   Not on file  Tobacco Use   Smoking status: Never    Passive exposure: Never   Smokeless tobacco: Never  Vaping Use   Vaping Use: Never used  Substance and Sexual Activity   Alcohol use: Not Currently    Comment: social   Drug use: No   Sexual activity: Yes  Other Topics Concern   Not on file  Social History Narrative   Not on file   Social Determinants of Health   Financial Resource Strain: Low Risk  (09/17/2021)   Overall Financial Resource Strain (CARDIA)    Difficulty of Paying Living Expenses: Not very hard  Food Insecurity: No Food Insecurity (07/15/2022)   Hunger Vital Sign    Worried About Running Out of Food in the Last Year: Never true    Ran Out of Food in the Last Year: Never true  Transportation Needs: No Transportation Needs (07/15/2022)   PRAPARE - Administrator, Civil Service (Medical): No    Lack of Transportation (Non-Medical): No  Physical Activity: Inactive (09/17/2021)   Exercise Vital Sign    Days of Exercise per Week: 0 days    Minutes of Exercise per Session: 0 min  Stress: Not on file  Social Connections: Not on file  Intimate Partner Violence: Not At Risk (07/15/2022)   Humiliation, Afraid, Rape, and Kick questionnaire    Fear of Current or Ex-Partner: No    Emotionally Abused: No    Physically Abused: No    Sexually Abused: No      Review of Systems    All other ros negative   Objective:    BP (!) 140/89   Pulse 88   Resp 16   Ht 5\' 8"  (1.727 m)   Wt 207 lb (93.9 kg)   LMP 02/19/2023 (Exact Date)   SpO2 99%   BMI 31.47 kg/m  Nursing note and vital signs reviewed.  Physical Exam     General/constitutional: no distress,  pleasant HEENT: Normocephalic, PER, Conj Clear, EOMI, Oropharynx clear Neck supple CV: rrr no mrg Lungs: clear to auscultation, normal respiratory effort Abd: Soft, Nontender Ext: no edema Skin: No Rash Neuro: nonfocal MSK: no peripheral joint swelling/tenderness/warmth; back spines nontender        Labs: Lab Results  Component Value Date   WBC 7.4 02/19/2023   HGB 11.7 (L) 02/19/2023   HCT 35.9 (L) 02/19/2023   MCV 80.3 02/19/2023   PLT 289 02/19/2023   Last metabolic panel Lab Results  Component Value Date   GLUCOSE 113 (H) 02/19/2023   NA 136 02/19/2023   K  3.3 (L) 02/19/2023   CL 104 02/19/2023   CO2 22 02/19/2023   BUN 11 02/19/2023   CREATININE 0.77 02/19/2023   GFRNONAA >60 02/19/2023   CALCIUM 8.9 02/19/2023   PROT 7.1 02/19/2023   ALBUMIN 3.7 02/19/2023   BILITOT 0.5 02/19/2023   ALKPHOS 45 02/19/2023   AST 17 02/19/2023   ALT 22 02/19/2023   ANIONGAP 10 02/19/2023    Micro:  Serology:  Imaging: 9/25 ct abd pelv Findings are most consistent with pelvic inflammatory disease with tubo-ovarian abscess along the left posterior uterus, measuring up to 5.8 x 5.1 x 5.1 cm. Extensive stranding in the pelvis and small volume abdominopelvic ascites. A component of postprocedural change in the pelvis is possible, however, this would be a diagnosis of exclusion.  12/14 ct abd pelv 1. Increased size 8.8 x 7.3 cm tubo-ovarian abscess arising from the left adnexa. 2. Increased pelvic free fluid with pelvic peritoneal hyperenhancement compatible with peritonitis. 3. New developing fluid collection in the right hemipelvis measures 3.7 x 1.2 cm. 4. New mild left hydronephrosis to the level of the walled-off fluid collections in the pelvis.   11/21/22 abd pelv ct 1. Interval placement of a pelvic drain via the left lower quadrant of the abdomen. Largest fluid collection posterior to the uterus has resolved. At least 2 other adnexal collections have  slightly decreased in size. Slight enlargement of 2 small fluid collections around the uterus. Findings compatible with partially treated tubo-ovarian abscess. 2. Duplicated IVC, incidental finding. 3. Left hydronephrosis has resolved since 10/03/2022.   12/05/22 abd pelv ct Full report pending   02/19/23 abd pelv ct with contrast 1. Previously noted left-sided pigtail drainage catheter has been removed. Left fallopian tube remains mildly dilated, although less so than the prior study, which may suggest residual pyosalpinx and/or hydrosalpinx. Importantly, however, there are increasingly prominent low-attenuation regions in the uterus, as detailed above, suspicious for myometrial abscesses. The largest of these anteriorly may have a fistulous tract extending along the subserosal aspect of the uterus. 2. Trace volume of ascites.    Assessment & Plan:   Problem List Items Addressed This Visit   None   Abx: 12/06-2/15/24; 02/19/23-c cefadroxil/flagyl  9/28-9/30 vanc/flagyl/ceftriaxone  9/30-c amox-clav   ASSESSMENT: TOA post ovarian sampling; 5 cm abscess on ct scan Sepsis resolved Endometriosis    Sepsis resolved as of 9/28. Clinically doing better 9/28 bcx while on abx ngtd If continued improvement by tomorrow, given negative std testing (and low risk), can discharge on amox-clav Planning 6 weeks abx on hand and will see in rcid clinic to determine further treatment course/evaluation   -------- 08/07/22 assessment Doing well Discussed 2 ways of going about the small abscess. 6 weeks and then monitor off abx vs repeat ct and see (which ct can lag behind tx success in terms of sterility of abscess and body self resorbing it)  Agree to do treatment and then observe off abx  -finish 3 more weeks abx -f/u 6-7 weeks -labs today  -advise to discuss with obgyn to see if any concern about dvt on aygestin given prior episode of ruq pain with some pleuritic quality  (resolved now)  -------- 09/24/22 id assessment Off abx 4 weeks now Some discomfort left lower quadrant and an isolated fever. Not sure what to make of this yet. Given these sx will get both labs and ct scan. Nontender today on exam   F/u 1 week after ct done; video visit ok F/u sooner if fever/chill persistent  abd pain  ------------ addendum I called patient about extremely elevated crp 260 Given her discomfort and fever I would rather have her back on abx while waiting for the ct scan which could be another 2-3 weeks  She has lots of gi sx with amox-clav. I discuss cefadroxil/flagyl as another possibility. She is trying to get pregnant and I also spoke with obgyn on call for the day and flagyl would be ok to use.  60 days with 30 days 1 refill rx to [facility]  Patient acknowledged    10/23/22 assessment Pending ir drain placement 1/19 Doing well on cefadroxil flagyl no sign of sepsis or abd pain Advise culture during ir drain placement F/u 6 weeks No evidence of thrush as patient query  12/05/22 assessment All drains removed today Reviewed IR note Given relapse previously and some fluid density still there, will keep on abx for another 3-4 weeks 2 more weeks cefadroxil/flagyl rx given (she has 1-2 more weeks left) See me in about 6-7 weeks She asked to defer labs today and we can do in 6 weeks   02/26/23 assessment Ct 5/01 reviewed concerning for endometrial/myometrial abscess with fistulous connection to serosal surface Unfortunate case Will see what gynecology have in mind in terms of surgical requirement for uterine abscess. Seems to be doing better with abx now Increase dose cefadroxil to 1 gram bid; continue flagyl Plan abx 6 weeks (potentially shorter if surgery planned)  F/u 6 weeks  I have spent a total of 30 minutes of face-to-face and non-face-to-face time, excluding clinical staff time, preparing to see patient, ordering tests and/or medications, and provide  counseling the patient    Follow-up: No follow-ups on file.      Raymondo Band, MD [facility] 02/26/2023, 4:07 PM

## 2023-02-26 NOTE — Patient Instructions (Signed)
Let's see what your gynecologist say  For now continue metronidazole 500 mg twice a day. And cefadroxil (increase to 2 tablet in morning and 2 tablet in evenining so 1000 mg twice a day)   See me in 6 weeks

## 2023-03-11 ENCOUNTER — Encounter: Payer: Self-pay | Admitting: Internal Medicine

## 2023-03-12 ENCOUNTER — Ambulatory Visit
Admission: RE | Admit: 2023-03-12 | Discharge: 2023-03-12 | Disposition: A | Discharge status: Home / Self Care | Payer: No Typology Code available for payment source | Source: Ambulatory Visit | Attending: Internal Medicine | Admitting: Internal Medicine

## 2023-03-12 ENCOUNTER — Other Ambulatory Visit: Payer: Self-pay | Admitting: Internal Medicine

## 2023-03-12 DIAGNOSIS — M5441 Lumbago with sciatica, right side: Secondary | ICD-10-CM

## 2023-04-03 ENCOUNTER — Ambulatory Visit: Payer: No Typology Code available for payment source | Admitting: Internal Medicine

## 2023-04-04 IMAGING — MG DIGITAL DIAGNOSTIC BILAT W/ TOMO W/ CAD
8 of 14 series · 8 of 40 positions shown · non-contrast
Comparison: Previous exam(s).

CLINICAL DATA: 36-year-old female presenting for evaluation of a
new lump in the right breast.

EXAM:
DIGITAL DIAGNOSTIC BILATERAL MAMMOGRAM WITH TOMOSYNTHESIS AND CAD;
ULTRASOUND RIGHT BREAST LIMITED
TECHNIQUE: Bilateral digital diagnostic mammography and breast tomosynthesis
was performed. The images were evaluated with computer-aided
detection.; Targeted ultrasound examination of the right breast was
performed

[R MLO synth-2D (1 of 2)]
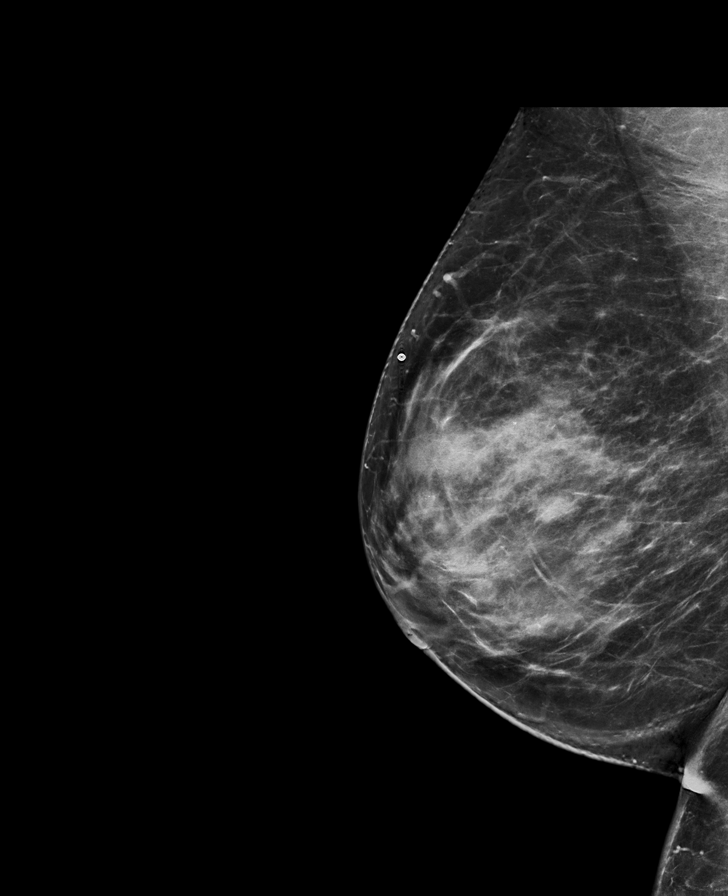

[R MLO synth-2D (2 of 2)]
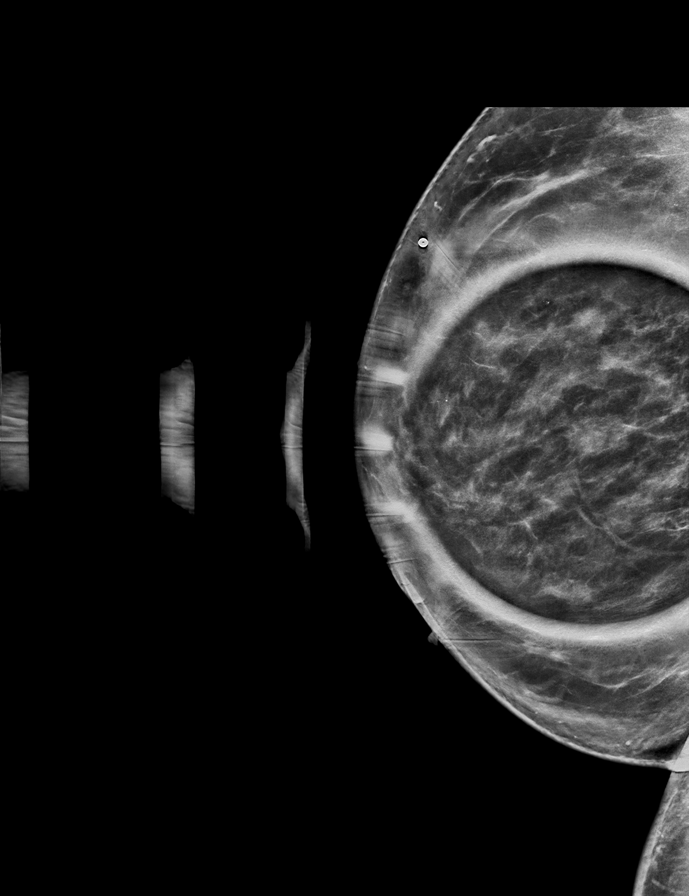

[R ML synth-2D]
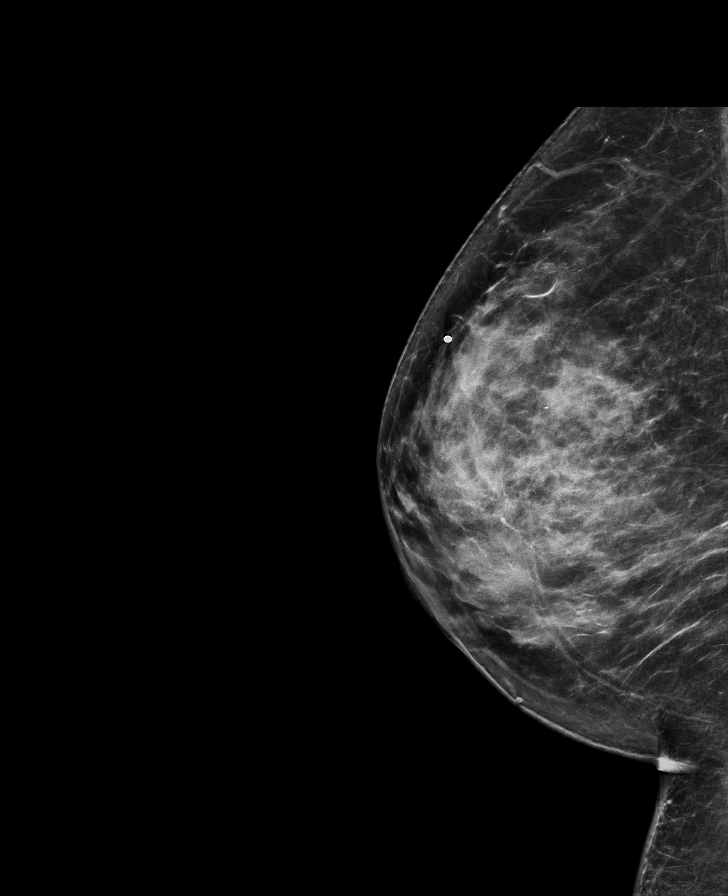

[L CC synth-2D]
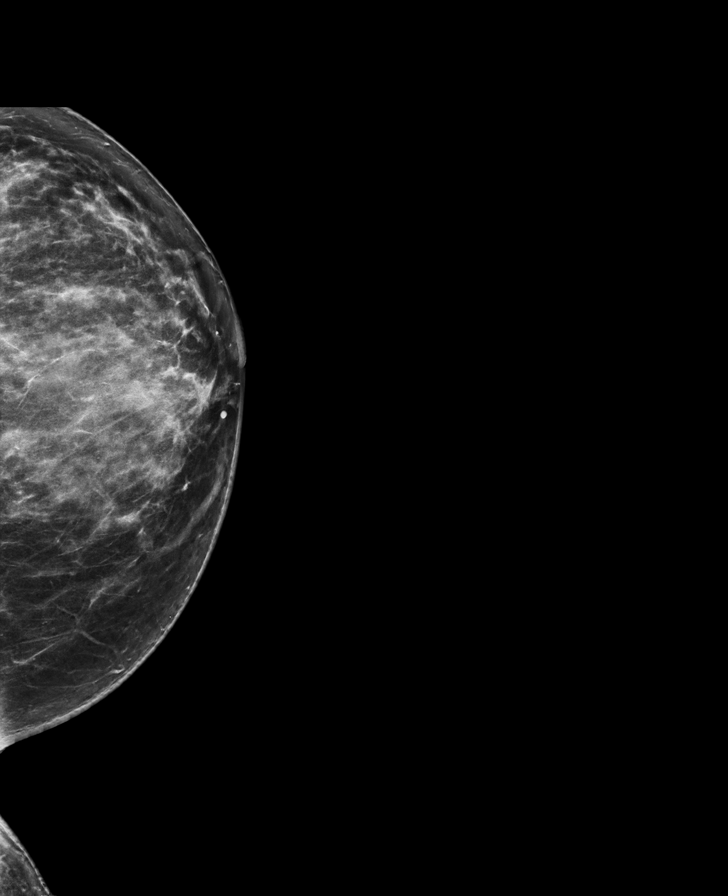

[L MLO synth-2D]
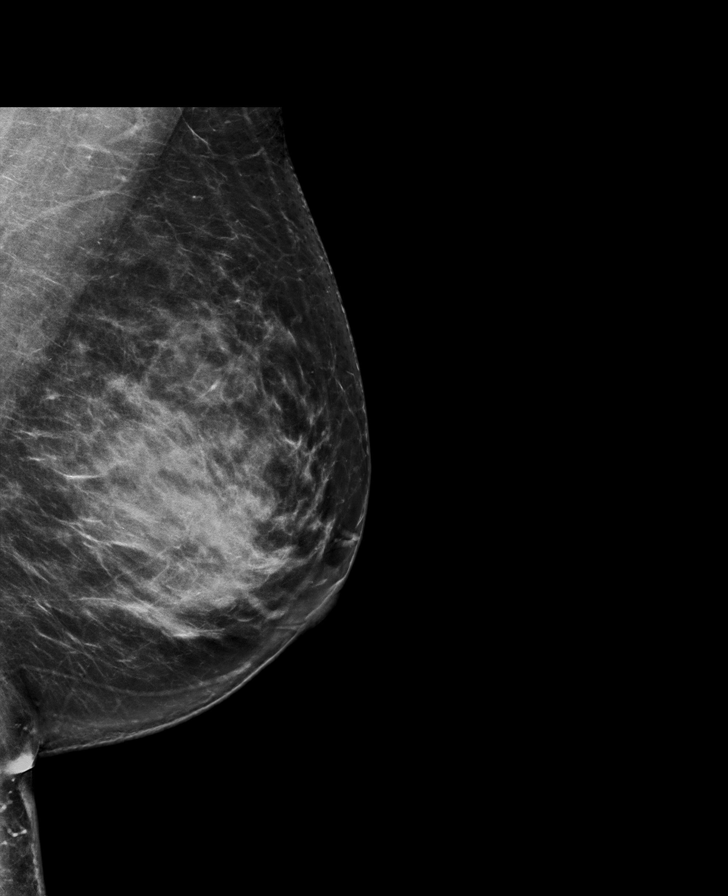

[R TAN synth-2D]
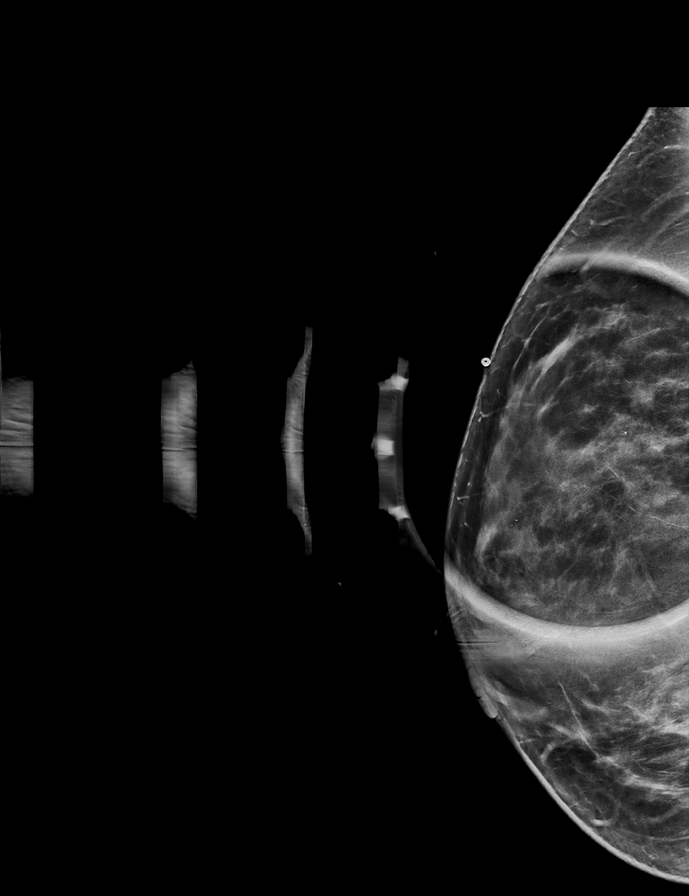

[R CC synth-2D]
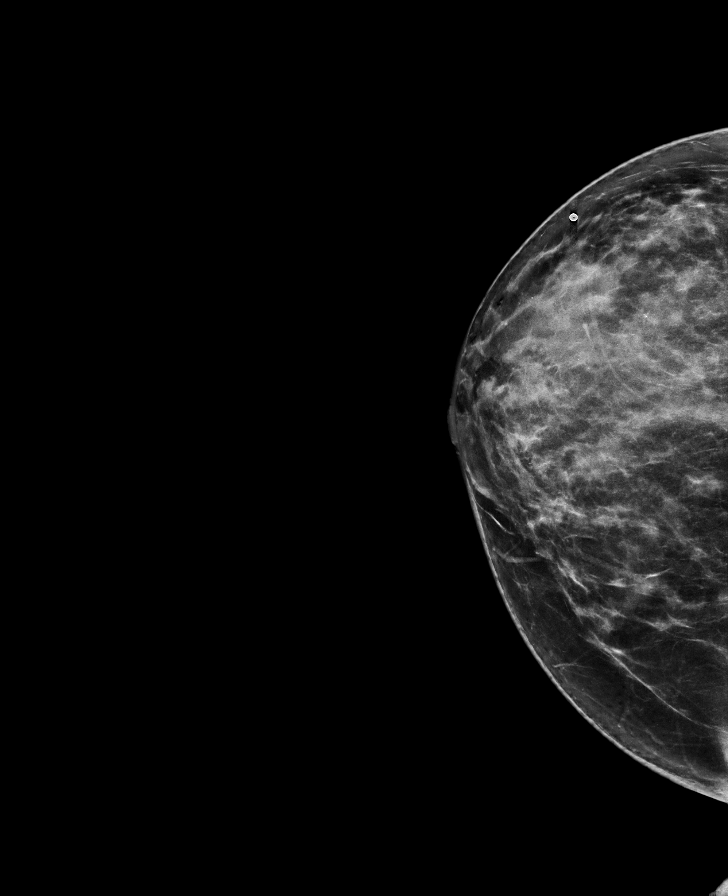

[R TAN tomo · tomo slice 36/71.0]
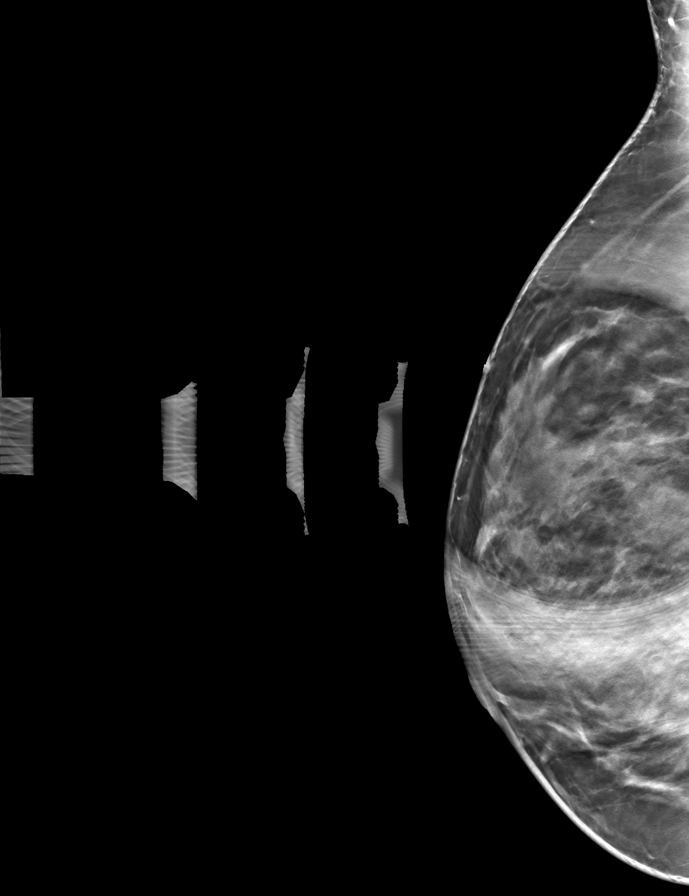

[8 of 40 positions shown; findings below may reference images not displayed]

ACR Breast Density Category c: The breast tissue is heterogeneously
dense, which may obscure small masses.
FINDINGS: Mammogram:

Right breast: A skin BB marks the palpable site of concern reported
by the patient in the upper outer right breast. A spot tangential
view of this area is performed in addition to standard views. There
is no new abnormality at the palpable site. An additional spot MLO
view and full field mL views of the right breast were performed for
a questioned asymmetry seen only on MLO view. The asymmetry resolves
on the additional imaging consistent with normal fibroglandular
tissue.

Left breast: No suspicious mass, distortion, or microcalcifications
are identified to suggest presence of malignancy.

On physical exam at the site of concern reported by the patient in
the upper-outer right breast I do not feel a discrete mass or focal
area of thickening.

Ultrasound:

Targeted ultrasound performed at the palpable site of concern
reported by the patient in the right breast at 11 o'clock 4 cm from
the nipple demonstrating no cystic or solid mass.
IMPRESSION: 1. At the palpable site of concern in the right breast there is no
mammographic or sonographic evidence of malignancy.

2.  No mammographic evidence of malignancy in the left breast.

RECOMMENDATION:
1. Recommend any further workup of the palpable site in the right
breast be on a clinical basis.

2.  Return for routine annual screening mammography at age 38.

I have discussed the findings and recommendations with the patient.
If applicable, a reminder letter will be sent to the patient
regarding the next appointment.

BI-RADS CATEGORY  1: Negative.

## 2023-04-04 IMAGING — US US BREAST*R* LIMITED INC AXILLA
1 series · 2 of 2 positions shown · non-contrast
Comparison: Previous exam(s).

CLINICAL DATA: 36-year-old female presenting for evaluation of a
new lump in the right breast.

EXAM:
DIGITAL DIAGNOSTIC BILATERAL MAMMOGRAM WITH TOMOSYNTHESIS AND CAD;
ULTRASOUND RIGHT BREAST LIMITED
TECHNIQUE: Bilateral digital diagnostic mammography and breast tomosynthesis
was performed. The images were evaluated with computer-aided
detection.; Targeted ultrasound examination of the right breast was
performed

[Series 1: us breast*right* limited inc axilla · 0.07mm/px · 2 of 2 slices shown]
[im 1/2]
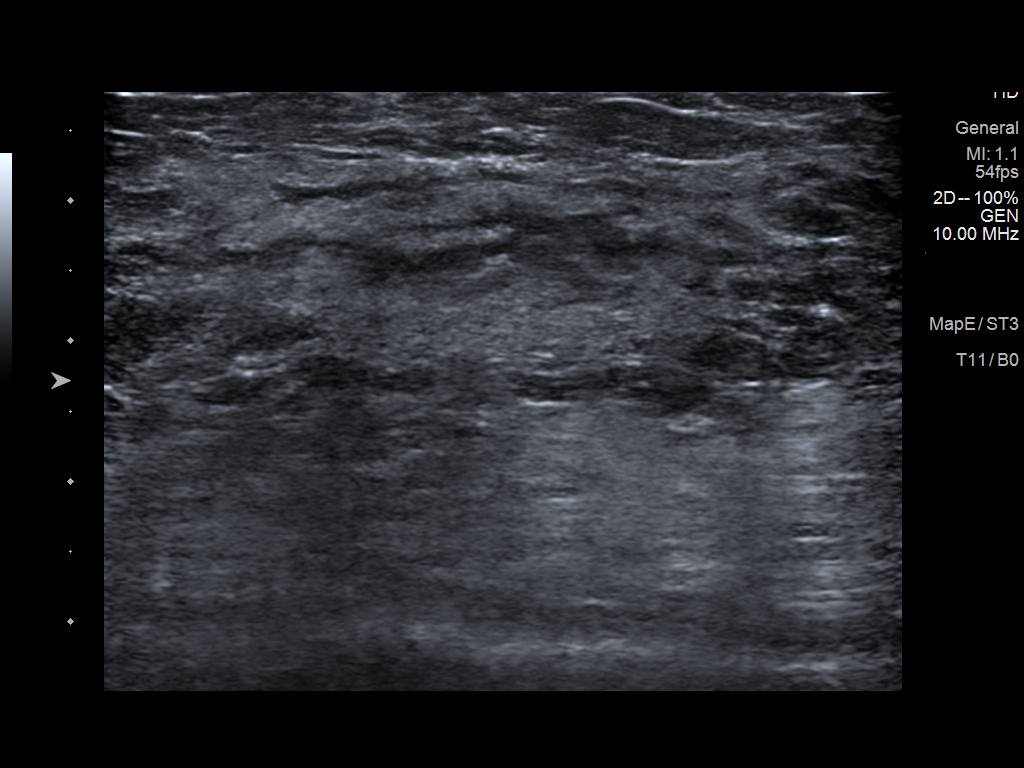
[im 2/2]
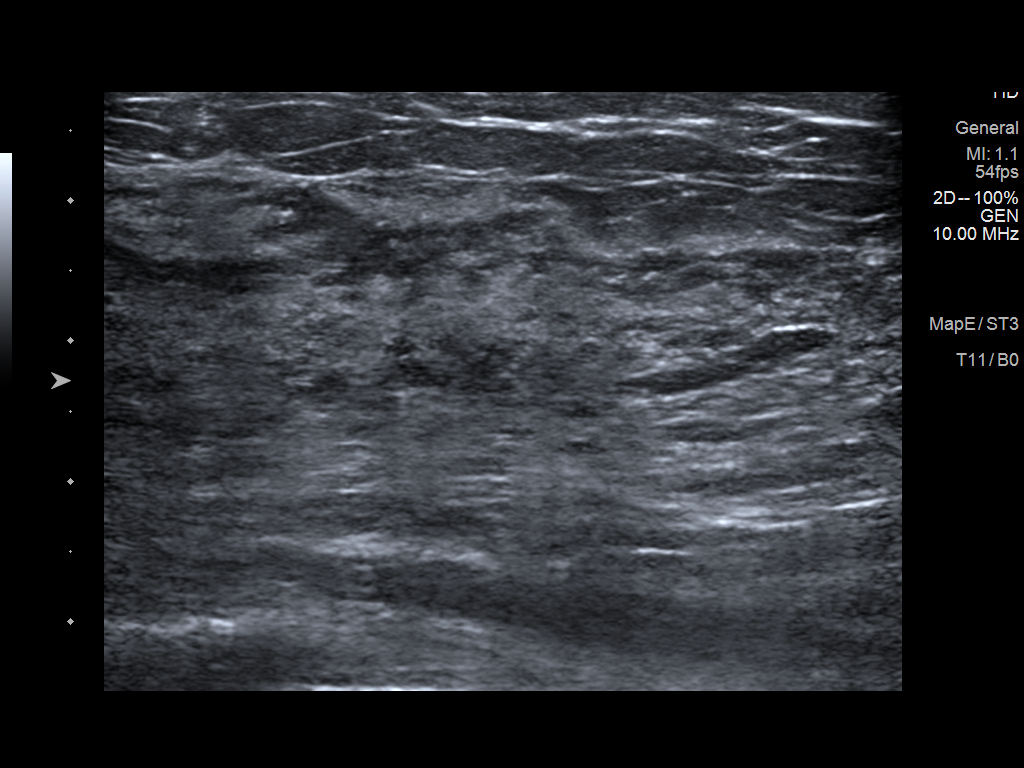

[2 of 2 positions shown; findings below may reference images not displayed]

ACR Breast Density Category c: The breast tissue is heterogeneously
dense, which may obscure small masses.
FINDINGS: Mammogram:

Right breast: A skin BB marks the palpable site of concern reported
by the patient in the upper outer right breast. A spot tangential
view of this area is performed in addition to standard views. There
is no new abnormality at the palpable site. An additional spot MLO
view and full field mL views of the right breast were performed for
a questioned asymmetry seen only on MLO view. The asymmetry resolves
on the additional imaging consistent with normal fibroglandular
tissue.

Left breast: No suspicious mass, distortion, or microcalcifications
are identified to suggest presence of malignancy.

On physical exam at the site of concern reported by the patient in
the upper-outer right breast I do not feel a discrete mass or focal
area of thickening.

Ultrasound:

Targeted ultrasound performed at the palpable site of concern
reported by the patient in the right breast at 11 o'clock 4 cm from
the nipple demonstrating no cystic or solid mass.
IMPRESSION: 1. At the palpable site of concern in the right breast there is no
mammographic or sonographic evidence of malignancy.

2.  No mammographic evidence of malignancy in the left breast.

RECOMMENDATION:
1. Recommend any further workup of the palpable site in the right
breast be on a clinical basis.

2.  Return for routine annual screening mammography at age 38.

I have discussed the findings and recommendations with the patient.
If applicable, a reminder letter will be sent to the patient
regarding the next appointment.

BI-RADS CATEGORY  1: Negative.

## 2023-04-18 ENCOUNTER — Other Ambulatory Visit (HOSPITAL_COMMUNITY): Payer: Self-pay

## 2023-05-15 ENCOUNTER — Ambulatory Visit: Payer: No Typology Code available for payment source | Admitting: Internal Medicine

## 2023-05-26 ENCOUNTER — Encounter: Payer: Self-pay | Admitting: Internal Medicine

## 2023-05-28 ENCOUNTER — Other Ambulatory Visit: Payer: Self-pay

## 2023-05-28 ENCOUNTER — Telehealth: Payer: No Typology Code available for payment source | Admitting: Internal Medicine

## 2023-05-28 DIAGNOSIS — N739 Female pelvic inflammatory disease, unspecified: Secondary | ICD-10-CM | POA: Diagnosis not present

## 2023-05-28 NOTE — Progress Notes (Signed)
[facility]  Patient Active Problem List   Diagnosis Date Noted   Screening for HIV (human immunodeficiency virus) 10/13/2022   Medication monitoring encounter 10/10/2022   Pelvic abscess in female 10/10/2022   Patellofemoral pain syndrome 09/06/2022   Abdominal pain with fever after surgery 07/16/2022   Tubo-ovarian abscess 07/15/2022   Positive ANA (antinuclear antibody) 02/28/2022   Menstrual disorder 02/27/2022   Family history of other endocrine, nutritional and metabolic diseases 02/27/2022   Menorrhagia 02/27/2022   Female infertility 02/27/2022   Prediabetes 02/27/2022   Vitamin D deficiency 02/27/2022   Essential hypertension 09/17/2021   Goiter 01/11/2013      Subjective:    Patient ID: April Holland, female    DOB: [birth year], 37 y.o.   MRN: [id number]  No chief complaint on file.   HPI:  April Holland is a 37 y.o. female endometriosis here for hospital f/u TOA post ovarian sampling for preparation of fertility planning  She had a 5 cm abscess on ct scan Sepsis on presentation; negative bcx Negative std screen No sampling possible Started on iv bsAbx transitioned to amox-clav planned 6 weeks on discharge on 07/20/22  Reports ruq pain but no f/c and not present at this time. One episode about 1 week ago. No hx prior biliary stone. Wasn't triggered after a meal but after she woke up from nap. It hurts more when she takes a deep breath  No cough/short of breath No n/v/diarrhea/rash  Her stamina is improving but not back yet  She is still on hormone. Non-smoker  Asked about management of constipation. Taking advil not opioid  She has about 3 more weeks to go with antibiotics   ------------- 09/24/22 id f/u She is done with abx for about 4 weeks Back on her hormone suppresion No f/c Some discomfort left lower quadrant but has a lot of recent walking in [place]. This has gotten  better She feels a little bloated otherwise Appetite good Have had a fever the last week 101. Never had fever with hormone suppression therapy for endometriosis. Covid test was negative. No URI sx.  10/23/22 id clinic f/u Patient had repeat ct that showed recurrence of abscess Restarted on cefadroxil/flagyl Will have IR drain placement 1/19 No f/c/abd pain since 12/5 visit Asked if she has thrush (no sx but tonge appears white)  No n/v/diarrhea/rash    12/05/22 id f/u Patient has had 2 ir visits since last seen me; most recent today -- all drains removed See ct scan result from 2/1; 2/15 result still pending but essentially stable finding No side effect from abx Taking cefadroxil and metronidazole Has about 2 weeks left     02/26/23 id clinic f/u Patient had abd pain and went to ed 02/19/23 abd pelv ct showed myometrial abscess She was started on flagyl/cefadroxil again No pain Only taking 500 mg cefadroxil bid though No n/v/diarrhea No f/c Seeing gyne in a few weeks    05/28/23 id clinic f/u See a&P  Allergies  Allergen Reactions   Elemental Sulfur Rash   Sulfa Antibiotics Other (See Comments)   Sulfamethoxazole Rash      Outpatient Medications Prior to Visit  Medication Sig Dispense Refill   Boric Acid Vaginal 600 MG SUPP Place 600 mg vaginally daily as needed (as needed).     Ibuprofen-Acetaminophen (ADVIL DUAL ACTION) 125-250 MG TABS Take 125-250 mg by mouth daily.  oxyCODONE-acetaminophen (PERCOCET/ROXICET) 5-325 MG tablet Take 1 tablet by mouth every 6 (six) hours as needed for severe pain. 8 tablet 0   Sodium Chloride Flush (NORMAL SALINE FLUSH) 0.9 % SOLN FLush with 5 mls daily (Patient not taking: Reported on 12/05/2022) 100 mL 0   No facility-administered medications prior to visit.     Social History   Socioeconomic History   Marital status: Unknown    Spouse name: Not on file   Number of children: Not on file   Years of education: Not on file    Highest education level: Not on file  Occupational History   Not on file  Tobacco Use   Smoking status: Never    Passive exposure: Never   Smokeless tobacco: Never  Vaping Use   Vaping status: Never Used  Substance and Sexual Activity   Alcohol use: Not Currently    Comment: social   Drug use: No   Sexual activity: Yes  Other Topics Concern   Not on file  Social History Narrative   Not on file   Social Determinants of Health   Financial Resource Strain: Low Risk  (09/17/2021)   Overall Financial Resource Strain (CARDIA)    Difficulty of Paying Living Expenses: Not very hard  Food Insecurity: No Food Insecurity (07/15/2022)   Hunger Vital Sign    Worried About Running Out of Food in the Last Year: Never true    Ran Out of Food in the Last Year: Never true  Transportation Needs: No Transportation Needs (07/15/2022)   PRAPARE - Administrator, Civil Service (Medical): No    Lack of Transportation (Non-Medical): No  Physical Activity: Inactive (09/17/2021)   Exercise Vital Sign    Days of Exercise per Week: 0 days    Minutes of Exercise per Session: 0 min  Stress: Not on file  Social Connections: Not on file  Intimate Partner Violence: Not At Risk (07/15/2022)   Humiliation, Afraid, Rape, and Kick questionnaire    Fear of Current or Ex-Partner: No    Emotionally Abused: No    Physically Abused: No    Sexually Abused: No      Review of Systems    All other ros negative   Objective:    There were no vitals taken for this visit. Nursing note and vital signs reviewed.  Physical Exam Video visit 05/28/23    General: no distress; conversant Heent: normocephalic Pulm: normal respiratory effort        Labs: Lab Results  Component Value Date   WBC 7.4 02/19/2023   HGB 11.7 (L) 02/19/2023   HCT 35.9 (L) 02/19/2023   MCV 80.3 02/19/2023   PLT 289 02/19/2023   Last metabolic panel Lab Results  Component Value Date   GLUCOSE 113 (H) 02/19/2023    NA 136 02/19/2023   K 3.3 (L) 02/19/2023   CL 104 02/19/2023   CO2 22 02/19/2023   BUN 11 02/19/2023   CREATININE 0.77 02/19/2023   GFRNONAA >60 02/19/2023   CALCIUM 8.9 02/19/2023   PROT 7.1 02/19/2023   ALBUMIN 3.7 02/19/2023   BILITOT 0.5 02/19/2023   ALKPHOS 45 02/19/2023   AST 17 02/19/2023   ALT 22 02/19/2023   ANIONGAP 10 02/19/2023    Micro:  Serology:  Imaging: 9/25 ct abd pelv Findings are most consistent with pelvic inflammatory disease with tubo-ovarian abscess along the left posterior uterus, measuring up to 5.8 x 5.1 x 5.1 cm. Extensive stranding in the pelvis and  small volume abdominopelvic ascites. A component of postprocedural change in the pelvis is possible, however, this would be a diagnosis of exclusion.  12/14 ct abd pelv 1. Increased size 8.8 x 7.3 cm tubo-ovarian abscess arising from the left adnexa. 2. Increased pelvic free fluid with pelvic peritoneal hyperenhancement compatible with peritonitis. 3. New developing fluid collection in the right hemipelvis measures 3.7 x 1.2 cm. 4. New mild left hydronephrosis to the level of the walled-off fluid collections in the pelvis.   11/21/22 abd pelv ct 1. Interval placement of a pelvic drain via the left lower quadrant of the abdomen. Largest fluid collection posterior to the uterus has resolved. At least 2 other adnexal collections have slightly decreased in size. Slight enlargement of 2 small fluid collections around the uterus. Findings compatible with partially treated tubo-ovarian abscess. 2. Duplicated IVC, incidental finding. 3. Left hydronephrosis has resolved since 10/03/2022.   12/05/22 abd pelv ct Full report pending   02/19/23 abd pelv ct with contrast 1. Previously noted left-sided pigtail drainage catheter has been removed. Left fallopian tube remains mildly dilated, although less so than the prior study, which may suggest residual pyosalpinx and/or hydrosalpinx. Importantly,  however, there are increasingly prominent low-attenuation regions in the uterus, as detailed above, suspicious for myometrial abscesses. The largest of these anteriorly may have a fistulous tract extending along the subserosal aspect of the uterus. 2. Trace volume of ascites.    Assessment & Plan:   Problem List Items Addressed This Visit       Other   Pelvic abscess in female - Primary    Abx: 12/06-2/15/24; 02/19/23-c cefadroxil/flagyl  9/28-9/30 vanc/flagyl/ceftriaxone  9/30-c amox-clav   ASSESSMENT: TOA post ovarian sampling; 5 cm abscess on ct scan Sepsis resolved Endometriosis    Sepsis resolved as of 9/28. Clinically doing better 9/28 bcx while on abx ngtd If continued improvement by tomorrow, given negative std testing (and low risk), can discharge on amox-clav Planning 6 weeks abx on hand and will see in [facility]id clinic to determine further treatment course/evaluation   -------- 08/07/22 assessment Doing well Discussed 2 ways of going about the small abscess. 6 weeks and then monitor off abx vs repeat ct and see (which ct can lag behind tx success in terms of sterility of abscess and body self resorbing it)  Agree to do treatment and then observe off abx  -finish 3 more weeks abx -f/u 6-7 weeks -labs today  -advise to discuss with obgyn to see if any concern about dvt on aygestin given prior episode of ruq pain with some pleuritic quality (resolved now)  -------- 09/24/22 id assessment Off abx 4 weeks now Some discomfort left lower quadrant and an isolated fever. Not sure what to make of this yet. Given these sx will get both labs and ct scan. Nontender today on exam   F/u 1 week after ct done; video visit ok F/u sooner if fever/chill persistent abd pain  ------------ addendum I called patient about extremely elevated crp 260 Given her discomfort and fever I would rather have her back on abx while waiting for the ct scan which could be another 2-3  weeks  She has lots of gi sx with amox-clav. I discuss cefadroxil/flagyl as another possibility. She is trying to get pregnant and I also spoke with obgyn on call for the day and flagyl would be ok to use.  60 days with 30 days 1 refill rx to [facility]  Patient acknowledged    10/23/22 assessment Pending ir  drain placement 1/19 Doing well on cefadroxil flagyl no sign of sepsis or abd pain Advise culture during ir drain placement F/u 6 weeks No evidence of thrush as patient query  12/05/22 assessment All drains removed today Reviewed IR note Given relapse previously and some fluid density still there, will keep on abx for another 3-4 weeks 2 more weeks cefadroxil/flagyl rx given (she has 1-2 more weeks left) See me in about 6-7 weeks She asked to defer labs today and we can do in 6 weeks   02/26/23 assessment Ct 5/01 reviewed concerning for endometrial/myometrial abscess with fistulous connection to serosal surface Unfortunate case Will see what gynecology have in mind in terms of surgical requirement for uterine abscess. Seems to be doing better with abx now Increase dose cefadroxil to 1 gram bid; continue flagyl Plan abx 6 weeks (potentially shorter if surgery planned)  F/u 6 weeks  05/28/23 id clinic assessment Patient finished antibiotics about 5 weeks prior to this visit Doing well since stopping antibiotics. Uterine fibroid pain intermittently but controlled with nsaids  No sign of infection (fever, malaise, persistent pelvic pain)  She had gynecologist visit after 02/2023 visit who thinks this is more c/w fibroid process and no surgery needed.   We'll follow up once in around another 6 weeks. As patient is doing very well off antibiotics, will avoid getting another ct scan. F/u sooner if symptoms of concern or fever/chill    I connected with  Wynelle Beckmann on 05/28/2023  I verified that I was speaking with the correct person using two identifiers. Due to the COVID-19  Pandemic/patient's request, this service was provided via telemedicine using audio/visual media.   The patient was located at home. The provider was located in the office. The patient did consent to this visit and is aware of charges through their insurance as well as the limitations of evaluation and management by telemedicine. Other persons participating in this telemedicine service were none. Time spent on visit was greater than 20 minutes on media and in coordination of care    Follow-up: Return in about 6 weeks (around 07/09/2023).      Raymondo Band, MD [facility] 05/28/2023, 3:20 PM

## 2023-06-24 ENCOUNTER — Telehealth: Payer: No Typology Code available for payment source | Admitting: Internal Medicine

## 2023-07-15 ENCOUNTER — Other Ambulatory Visit: Payer: Self-pay

## 2023-07-15 ENCOUNTER — Telehealth (INDEPENDENT_AMBULATORY_CARE_PROVIDER_SITE_OTHER): Payer: No Typology Code available for payment source | Admitting: Internal Medicine

## 2023-07-15 DIAGNOSIS — N739 Female pelvic inflammatory disease, unspecified: Secondary | ICD-10-CM | POA: Diagnosis not present

## 2023-07-15 NOTE — Progress Notes (Signed)
[facility]  Patient Active Problem List   Diagnosis Date Noted   Screening for HIV (human immunodeficiency virus) 10/13/2022   Medication monitoring encounter 10/10/2022   Pelvic abscess in female 10/10/2022   Patellofemoral pain syndrome 09/06/2022   Abdominal pain with fever after surgery 07/16/2022   Tubo-ovarian abscess 07/15/2022   Positive ANA (antinuclear antibody) 02/28/2022   Menstrual disorder 02/27/2022   Family history of other endocrine, nutritional and metabolic diseases 02/27/2022   Menorrhagia 02/27/2022   Female infertility 02/27/2022   Prediabetes 02/27/2022   Vitamin D deficiency 02/27/2022   Essential hypertension 09/17/2021   Goiter 01/11/2013      Subjective:    Patient ID: April Holland, female    DOB: 1986-07-22, 37 y.o.   MRN: [id number]  No chief complaint on file.   HPI:  April Holland is a 37 y.o. female endometriosis here for hospital f/u TOA post ovarian sampling for preparation of fertility planning  She had a 5 cm abscess on ct scan Sepsis on presentation; negative bcx Negative std screen No sampling possible Started on iv bsAbx transitioned to amox-clav planned 6 weeks on discharge on 07/20/22  Reports ruq pain but no f/c and not present at this time. One episode about 1 week ago. No hx prior biliary stone. Wasn't triggered after a meal but after she woke up from nap. It hurts more when she takes a deep breath  No cough/short of breath No n/v/diarrhea/rash  Her stamina is improving but not back yet  She is still on hormone. Non-smoker  Asked about management of constipation. Taking advil not opioid  She has about 3 more weeks to go with antibiotics   ------------- 09/24/22 id f/u She is done with abx for about 4 weeks Back on her hormone suppresion No f/c Some discomfort left lower quadrant but has a lot of recent walking in [place]. This has gotten  better She feels a little bloated otherwise Appetite good Have had a fever the last week 101. Never had fever with hormone suppression therapy for endometriosis. Covid test was negative. No URI sx.  10/23/22 id clinic f/u Patient had repeat ct that showed recurrence of abscess Restarted on cefadroxil/flagyl Will have IR drain placement 1/19 No f/c/abd pain since 12/5 visit Asked if she has thrush (no sx but tonge appears white)  No n/v/diarrhea/rash    12/05/22 id f/u Patient has had 2 ir visits since last seen me; most recent today -- all drains removed See ct scan result from 2/1; 2/15 result still pending but essentially stable finding No side effect from abx Taking cefadroxil and metronidazole Has about 2 weeks left     02/26/23 id clinic f/u Patient had abd pain and went to ed 02/19/23 abd pelv ct showed myometrial abscess She was started on flagyl/cefadroxil again No pain Only taking 500 mg cefadroxil bid though No n/v/diarrhea No f/c Seeing gyne in a few weeks   07/15/23 id clinic f/u See a&P  Allergies  Allergen Reactions   Elemental Sulfur Rash   Sulfa Antibiotics Other (See Comments)   Sulfamethoxazole Rash      Outpatient Medications Prior to Visit  Medication Sig Dispense Refill   Boric Acid Vaginal 600 MG SUPP Place 600 mg vaginally daily as needed (as needed).     Ibuprofen-Acetaminophen (ADVIL DUAL ACTION) 125-250 MG TABS Take 125-250 mg by mouth daily.     oxyCODONE-acetaminophen (  PERCOCET/ROXICET) 5-325 MG tablet Take 1 tablet by mouth every 6 (six) hours as needed for severe pain. 8 tablet 0   Sodium Chloride Flush (NORMAL SALINE FLUSH) 0.9 % SOLN FLush with 5 mls daily (Patient not taking: Reported on 12/05/2022) 100 mL 0   No facility-administered medications prior to visit.     Social History   Socioeconomic History   Marital status: Unknown    Spouse name: Not on file   Number of children: Not on file   Years of education: Not on file    Highest education level: Not on file  Occupational History   Not on file  Tobacco Use   Smoking status: Never    Passive exposure: Never   Smokeless tobacco: Never  Vaping Use   Vaping status: Never Used  Substance and Sexual Activity   Alcohol use: Not Currently    Comment: social   Drug use: No   Sexual activity: Yes  Other Topics Concern   Not on file  Social History Narrative   Not on file   Social Determinants of Health   Financial Resource Strain: Low Risk  (09/17/2021)   Overall Financial Resource Strain (CARDIA)    Difficulty of Paying Living Expenses: Not very hard  Food Insecurity: No Food Insecurity (07/15/2022)   Hunger Vital Sign    Worried About Running Out of Food in the Last Year: Never true    Ran Out of Food in the Last Year: Never true  Transportation Needs: No Transportation Needs (07/15/2022)   PRAPARE - Administrator, Civil Service (Medical): No    Lack of Transportation (Non-Medical): No  Physical Activity: Inactive (09/17/2021)   Exercise Vital Sign    Days of Exercise per Week: 0 days    Minutes of Exercise per Session: 0 min  Stress: Not on file  Social Connections: Not on file  Intimate Partner Violence: Not At Risk (07/15/2022)   Humiliation, Afraid, Rape, and Kick questionnaire    Fear of Current or Ex-Partner: No    Emotionally Abused: No    Physically Abused: No    Sexually Abused: No      Review of Systems    All other ros negative   Objective:    There were no vitals taken for this visit. Nursing note and vital signs reviewed.  Physical Exam Video visit 07/15/23    General: no distress; conversant Heent: normocephalic Pulm: normal respiratory effort        Labs: Lab Results  Component Value Date   WBC 7.4 02/19/2023   HGB 11.7 (L) 02/19/2023   HCT 35.9 (L) 02/19/2023   MCV 80.3 02/19/2023   PLT 289 02/19/2023   Last metabolic panel Lab Results  Component Value Date   GLUCOSE 113 (H) 02/19/2023    NA 136 02/19/2023   K 3.3 (L) 02/19/2023   CL 104 02/19/2023   CO2 22 02/19/2023   BUN 11 02/19/2023   CREATININE 0.77 02/19/2023   GFRNONAA >60 02/19/2023   CALCIUM 8.9 02/19/2023   PROT 7.1 02/19/2023   ALBUMIN 3.7 02/19/2023   BILITOT 0.5 02/19/2023   ALKPHOS 45 02/19/2023   AST 17 02/19/2023   ALT 22 02/19/2023   ANIONGAP 10 02/19/2023    Micro:  Serology:  Imaging: 9/25 ct abd pelv Findings are most consistent with pelvic inflammatory disease with tubo-ovarian abscess along the left posterior uterus, measuring up to 5.8 x 5.1 x 5.1 cm. Extensive stranding in the pelvis and small  volume abdominopelvic ascites. A component of postprocedural change in the pelvis is possible, however, this would be a diagnosis of exclusion.  12/14 ct abd pelv 1. Increased size 8.8 x 7.3 cm tubo-ovarian abscess arising from the left adnexa. 2. Increased pelvic free fluid with pelvic peritoneal hyperenhancement compatible with peritonitis. 3. New developing fluid collection in the right hemipelvis measures 3.7 x 1.2 cm. 4. New mild left hydronephrosis to the level of the walled-off fluid collections in the pelvis.   11/21/22 abd pelv ct 1. Interval placement of a pelvic drain via the left lower quadrant of the abdomen. Largest fluid collection posterior to the uterus has resolved. At least 2 other adnexal collections have slightly decreased in size. Slight enlargement of 2 small fluid collections around the uterus. Findings compatible with partially treated tubo-ovarian abscess. 2. Duplicated IVC, incidental finding. 3. Left hydronephrosis has resolved since 10/03/2022.   12/05/22 abd pelv ct Full report pending   02/19/23 abd pelv ct with contrast 1. Previously noted left-sided pigtail drainage catheter has been removed. Left fallopian tube remains mildly dilated, although less so than the prior study, which may suggest residual pyosalpinx and/or hydrosalpinx. Importantly,  however, there are increasingly prominent low-attenuation regions in the uterus, as detailed above, suspicious for myometrial abscesses. The largest of these anteriorly may have a fistulous tract extending along the subserosal aspect of the uterus. 2. Trace volume of ascites.    Assessment & Plan:   Problem List Items Addressed This Visit   None    Abx: 12/06-2/15/24; 02/19/23-c cefadroxil/flagyl  9/28-9/30 vanc/flagyl/ceftriaxone  9/30-c amox-clav   ASSESSMENT: TOA post ovarian sampling; 5 cm abscess on ct scan Sepsis resolved Endometriosis    Sepsis resolved as of 9/28. Clinically doing better 9/28 bcx while on abx ngtd If continued improvement by tomorrow, given negative std testing (and low risk), can discharge on amox-clav Planning 6 weeks abx on hand and will see in rcid clinic to determine further treatment course/evaluation   -------- 08/07/22 assessment Doing well Discussed 2 ways of going about the small abscess. 6 weeks and then monitor off abx vs repeat ct and see (which ct can lag behind tx success in terms of sterility of abscess and body self resorbing it)  Agree to do treatment and then observe off abx  -finish 3 more weeks abx -f/u 6-7 weeks -labs today  -advise to discuss with obgyn to see if any concern about dvt on aygestin given prior episode of ruq pain with some pleuritic quality (resolved now)  -------- 09/24/22 id assessment Off abx 4 weeks now Some discomfort left lower quadrant and an isolated fever. Not sure what to make of this yet. Given these sx will get both labs and ct scan. Nontender today on exam   F/u 1 week after ct done; video visit ok F/u sooner if fever/chill persistent abd pain  ------------ addendum I called patient about extremely elevated crp 260 Given her discomfort and fever I would rather have her back on abx while waiting for the ct scan which could be another 2-3 weeks  She has lots of gi sx with amox-clav. I  discuss cefadroxil/flagyl as another possibility. She is trying to get pregnant and I also spoke with obgyn on call for the day and flagyl would be ok to use.  60 days with 30 days 1 refill rx to [facility]  Patient acknowledged    10/23/22 assessment Pending ir drain placement 1/19 Doing well on cefadroxil flagyl no sign of sepsis or  abd pain Advise culture during ir drain placement F/u 6 weeks No evidence of thrush as patient query  12/05/22 assessment All drains removed today Reviewed IR note Given relapse previously and some fluid density still there, will keep on abx for another 3-4 weeks 2 more weeks cefadroxil/flagyl rx given (she has 1-2 more weeks left) See me in about 6-7 weeks She asked to defer labs today and we can do in 6 weeks   02/26/23 assessment Ct 5/01 reviewed concerning for endometrial/myometrial abscess with fistulous connection to serosal surface Unfortunate case Will see what gynecology have in mind in terms of surgical requirement for uterine abscess. Seems to be doing better with abx now Increase dose cefadroxil to 1 gram bid; continue flagyl Plan abx 6 weeks (potentially shorter if surgery planned)  F/u 6 weeks  05/28/23 id clinic assessment Patient finished antibiotics about 5 weeks prior to this visit Doing well since stopping antibiotics. Uterine fibroid pain intermittently but controlled with nsaids  No sign of infection (fever, malaise, persistent pelvic pain)  She had gynecologist visit after 02/2023 visit who thinks this is more c/w fibroid process and no surgery needed.   We'll follow up once in around another 6 weeks. As patient is doing very well off antibiotics, will avoid getting another ct scan. F/u sooner if symptoms of concern or fever/chill   ------- 07/15/23 televisit  Doing well No sign of infeciton No pelvic pain No vaginal discharge Good appetite Off abx at least 3 months now   Seems no further infection Discharge from  clinic   I connected with  Wynelle Beckmann on 07/15/2023  I verified that I was speaking with the correct person using two identifiers. Due to the COVID-19 Pandemic/patient's request, this service was provided via telemedicine using audio/visual media.   The patient was located at home. The provider was located in the office. The patient did consent to this visit and is aware of charges through their insurance as well as the limitations of evaluation and management by telemedicine. Other persons participating in this telemedicine service were none. Time spent on visit was greater than 20 minutes on media and in coordination of care    Follow-up: No follow-ups on file.      Raymondo Band, MD [facility] 07/15/2023, 3:57 PM

## 2023-09-01 ENCOUNTER — Encounter (HOSPITAL_COMMUNITY): Payer: Self-pay | Admitting: Emergency Medicine

## 2023-09-01 ENCOUNTER — Emergency Department (HOSPITAL_COMMUNITY)
Admission: EM | Admit: 2023-09-01 | Discharge: 2023-09-02 | Disposition: A | Discharge status: Home / Self Care | Payer: No Typology Code available for payment source | Attending: Emergency Medicine | Admitting: Emergency Medicine

## 2023-09-01 ENCOUNTER — Other Ambulatory Visit: Payer: Self-pay

## 2023-09-01 DIAGNOSIS — R102 Pelvic and perineal pain: Secondary | ICD-10-CM | POA: Diagnosis present

## 2023-09-01 DIAGNOSIS — N719 Inflammatory disease of uterus, unspecified: Secondary | ICD-10-CM | POA: Diagnosis not present

## 2023-09-01 LAB — URINALYSIS, ROUTINE W REFLEX MICROSCOPIC
Bilirubin Urine: NEGATIVE
Glucose, UA: NEGATIVE mg/dL
Hgb urine dipstick: NEGATIVE
Ketones, ur: NEGATIVE mg/dL
Leukocytes,Ua: NEGATIVE
Nitrite: NEGATIVE
Protein, ur: NEGATIVE mg/dL
Specific Gravity, Urine: 1.029 (ref 1.005–1.030)
pH: 5 (ref 5.0–8.0)

## 2023-09-01 LAB — HCG, SERUM, QUALITATIVE: Preg, Serum: NEGATIVE

## 2023-09-01 LAB — COMPREHENSIVE METABOLIC PANEL
ALT: 39 U/L (ref 0–44)
AST: 77 U/L — ABNORMAL HIGH (ref 15–41)
Albumin: 3.8 g/dL (ref 3.5–5.0)
Alkaline Phosphatase: 40 U/L (ref 38–126)
Anion gap: 5 (ref 5–15)
BUN: 8 mg/dL (ref 6–20)
CO2: 23 mmol/L (ref 22–32)
Calcium: 8.9 mg/dL (ref 8.9–10.3)
Chloride: 106 mmol/L (ref 98–111)
Creatinine, Ser: 0.87 mg/dL (ref 0.44–1.00)
GFR, Estimated: >60 mL/min (ref >60)
Glucose, Bld: 100 mg/dL — ABNORMAL HIGH (ref 70–99)
Potassium: 3.3 mmol/L — ABNORMAL LOW (ref 3.5–5.1)
Sodium: 134 mmol/L — ABNORMAL LOW (ref 135–145)
Total Bilirubin: 0.5 mg/dL (ref <1.2)
Total Protein: 7 g/dL (ref 6.5–8.1)

## 2023-09-01 LAB — CBC
HCT: 31.2 % — ABNORMAL LOW (ref 36.0–46.0)
Hemoglobin: 9.9 g/dL — ABNORMAL LOW (ref 12.0–15.0)
MCH: 27.4 pg (ref 26.0–34.0)
MCHC: 31.7 g/dL (ref 30.0–36.0)
MCV: 86.4 fL (ref 80.0–100.0)
Platelets: 326 10*3/uL (ref 150–400)
RBC: 3.61 MIL/uL — ABNORMAL LOW (ref 3.87–5.11)
RDW: 13.9 % (ref 11.5–15.5)
WBC: 5.1 10*3/uL (ref 4.0–10.5)
nRBC: 0 % (ref 0.0–0.2)

## 2023-09-01 LAB — LIPASE, BLOOD: Lipase: 32 U/L (ref 11–51)

## 2023-09-01 NOTE — ED Triage Notes (Addendum)
Pt her POV for lower abd and pelvic pain radiating to lower back. States she has been seen multiple times for the same, hx of uterine abscess. States she has been off abx for 3 months. States she has had a recent ultrasound on 10/31 for infertility treatments and was told she had a fluid collection around her R ovary. States the pain started at the end of her last cycle on 10/31 and has been persistent since then and has not had any relief.

## 2023-09-02 ENCOUNTER — Emergency Department (HOSPITAL_COMMUNITY): Payer: No Typology Code available for payment source

## 2023-09-02 MED ORDER — CEFADROXIL 500 MG PO CAPS
500.0000 mg | ORAL_CAPSULE | Freq: Once | ORAL | Status: AC
  Administered 2023-09-02: 500 mg via ORAL
  Filled 2023-09-02: qty 1

## 2023-09-02 MED ORDER — FENTANYL CITRATE PF 50 MCG/ML IJ SOSY
50.0000 ug | PREFILLED_SYRINGE | Freq: Once | INTRAMUSCULAR | Status: AC
  Administered 2023-09-02: 50 ug via INTRAVENOUS
  Filled 2023-09-02: qty 1

## 2023-09-02 MED ORDER — IOPAMIDOL (ISOVUE-370) INJECTION 76%
75.0000 mL | Freq: Once | INTRAVENOUS | Status: AC | PRN
  Administered 2023-09-02: 75 mL via INTRAVENOUS

## 2023-09-02 MED ORDER — CEFADROXIL 500 MG PO CAPS: 500.0000 mg | ORAL_CAPSULE | Freq: Two times a day (BID) | ORAL | 0 refills | Status: DC

## 2023-09-02 MED ORDER — KETOROLAC TROMETHAMINE 30 MG/ML IJ SOLN
30.0000 mg | Freq: Once | INTRAMUSCULAR | Status: AC
  Administered 2023-09-02: 30 mg via INTRAVENOUS
  Filled 2023-09-02: qty 1

## 2023-09-02 MED ORDER — METRONIDAZOLE 500 MG PO TABS
500.0000 mg | ORAL_TABLET | Freq: Once | ORAL | Status: AC
  Administered 2023-09-02: 500 mg via ORAL
  Filled 2023-09-02: qty 1

## 2023-09-02 MED ORDER — ONDANSETRON 4 MG PO TBDP: 4.0000 mg | ORAL_TABLET | Freq: Three times a day (TID) | ORAL | 0 refills | Status: DC | PRN

## 2023-09-02 MED ORDER — HYDROCODONE-ACETAMINOPHEN 5-325 MG PO TABS: 1.0000 | ORAL_TABLET | Freq: Four times a day (QID) | ORAL | 0 refills | Status: DC | PRN

## 2023-09-02 MED ORDER — METRONIDAZOLE 500 MG PO TABS: 500.0000 mg | ORAL_TABLET | Freq: Two times a day (BID) | ORAL | 0 refills | Status: DC

## 2023-09-02 NOTE — Discharge Instructions (Signed)
In addition to discussing your condition with your physician in 2 days, be sure to inform your IVF team of today's evaluation.  Take all medication as directed and do not hesitate to return here for concerning changes.

## 2023-09-02 NOTE — ED Provider Notes (Signed)
[facility] Provider Note   CSN: [id number] Arrival date & time: 09/01/23  1753     History  Chief Complaint  Patient presents with   Pelvic Pain   Back Pain    April Holland is a 37 y.o. female.  The history is provided by the patient.  Pelvic Pain This is a recurrent problem. The current episode started more than 1 week ago (12 days ago). The problem occurs constantly. The problem has not changed since onset.Pertinent negatives include no chest pain, no headaches and no shortness of breath. Nothing aggravates the symptoms. Nothing relieves the symptoms. She has tried nothing for the symptoms. The treatment provided no relief.  Back Pain Associated symptoms: pelvic pain   Associated symptoms: no chest pain, no fever and no headaches   Patient with a h/o pelvic abscesses who has had previous drains and has seen ID in the past presents with similar pain to previous abscesses.  She is scheduled to see GYN on Thursday.       Home Medications Prior to Admission medications   Medication Sig Start Date End Date Taking? Authorizing Provider  Boric Acid Vaginal 600 MG SUPP Place 600 mg vaginally daily as needed (as needed). 07/16/18   [provider]  Ibuprofen-Acetaminophen (ADVIL DUAL ACTION) 125-250 MG TABS Take 125-250 mg by mouth daily.    [provider]  oxyCODONE-acetaminophen (PERCOCET/ROXICET) 5-325 MG tablet Take 1 tablet by mouth every 6 (six) hours as needed for severe pain. 02/19/23   Benjiman Core, MD  Sodium Chloride Flush (NORMAL SALINE FLUSH) 0.9 % SOLN FLush with 5 mls daily Patient not taking: Reported on 12/05/2022 11/29/22   Richarda Overlie, MD      Allergies    Elemental sulfur, Sulfa antibiotics, and Sulfamethoxazole    Review of Systems   Review of Systems  Constitutional:  Negative for fever.  Respiratory:  Negative for shortness of breath.   Cardiovascular:  Negative for chest pain.   Gastrointestinal:  Negative for vomiting.  Genitourinary:  Positive for pelvic pain.  Musculoskeletal:  Positive for back pain.  Neurological:  Negative for headaches.  All other systems reviewed and are negative.   Physical Exam Updated Vital Signs BP (!) 152/95 (BP Location: Right Arm)   Pulse 74   Temp 98.4 F (36.9 C)   Resp 17   Ht 5\' 8"  (1.727 m)   Wt 96.2 kg   LMP 08/21/2023   SpO2 100%   BMI 32.23 kg/m  Physical Exam Vitals and nursing note reviewed.  Constitutional:      General: She is not in acute distress.    Appearance: She is well-developed.  HENT:     Head: Normocephalic and atraumatic.     Nose: Nose normal.  Eyes:     Pupils: Pupils are equal, round, and reactive to light.  Cardiovascular:     Rate and Rhythm: Normal rate and regular rhythm.  Pulmonary:     Effort: No respiratory distress.     Breath sounds: Normal breath sounds.  Abdominal:     General: Bowel sounds are normal. There is no distension.     Palpations: Abdomen is soft.     Tenderness: There is no abdominal tenderness. There is no guarding or rebound.  Musculoskeletal:        General: Normal range of motion.     Cervical back: Neck supple.  Skin:    General: Skin is warm and dry.  Capillary Refill: Capillary refill takes less than 2 seconds.     Findings: No erythema or rash.  Neurological:     General: No focal deficit present.     Mental Status: She is oriented to person, place, and time.     Deep Tendon Reflexes: Reflexes normal.  Psychiatric:        Mood and Affect: Mood normal.     ED Results / Procedures / Treatments   Labs (all labs ordered are listed, but only abnormal results are displayed) Results for orders placed or performed during the hospital encounter of 09/01/23  Lipase, blood  Result Value Ref Range   Lipase 32 11 - 51 U/L  Comprehensive metabolic panel  Result Value Ref Range   Sodium 134 (L) 135 - 145 mmol/L   Potassium 3.3 (L) 3.5 - 5.1 mmol/L    Chloride 106 98 - 111 mmol/L   CO2 23 22 - 32 mmol/L   Glucose, Bld 100 (H) 70 - 99 mg/dL   BUN 8 6 - 20 mg/dL   Creatinine, Ser 1.61 0.44 - 1.00 mg/dL   Calcium 8.9 8.9 - 09.6 mg/dL   Total Protein 7.0 6.5 - 8.1 g/dL   Albumin 3.8 3.5 - 5.0 g/dL   AST 77 (H) 15 - 41 U/L   ALT 39 0 - 44 U/L   Alkaline Phosphatase 40 38 - 126 U/L   Total Bilirubin 0.5 <1.2 mg/dL   GFR, Estimated >04 >54 mL/min   Anion gap 5 5 - 15  CBC  Result Value Ref Range   WBC 5.1 4.0 - 10.5 K/uL   RBC 3.61 (L) 3.87 - 5.11 MIL/uL   Hemoglobin 9.9 (L) 12.0 - 15.0 g/dL   HCT 09.8 (L) 11.9 - 14.7 %   MCV 86.4 80.0 - 100.0 fL   MCH 27.4 26.0 - 34.0 pg   MCHC 31.7 30.0 - 36.0 g/dL   RDW 82.9 56.2 - 13.0 %   Platelets 326 150 - 400 K/uL   nRBC 0.0 0.0 - 0.2 %  Urinalysis, Routine w reflex microscopic -Urine, Clean Catch  Result Value Ref Range   Color, Urine YELLOW YELLOW   APPearance CLEAR CLEAR   Specific Gravity, Urine 1.029 1.005 - 1.030   pH 5.0 5.0 - 8.0   Glucose, UA NEGATIVE NEGATIVE mg/dL   Hgb urine dipstick NEGATIVE NEGATIVE   Bilirubin Urine NEGATIVE NEGATIVE   Ketones, ur NEGATIVE NEGATIVE mg/dL   Protein, ur NEGATIVE NEGATIVE mg/dL   Nitrite NEGATIVE NEGATIVE   Leukocytes,Ua NEGATIVE NEGATIVE  hCG, serum, qualitative  Result Value Ref Range   Preg, Serum NEGATIVE NEGATIVE   No results found.  Radiology No results found.  Procedures Procedures    Medications Ordered in ED Medications  ketorolac (TORADOL) 30 MG/ML injection 30 mg (has no administration in time range)  fentaNYL (SUBLIMAZE) injection 50 mcg (has no administration in time range)  iopamidol (ISOVUE-370) 76 % injection 75 mL (75 mLs Intravenous Contrast Given 09/02/23 0605)    ED Course/ Medical Decision Making/ A&P                                 Medical Decision Making Patient with a h/o pelivic abscesses presents with pelvic pain   Amount and/or Complexity of Data Reviewed Labs: ordered.    Details: Urine  without UTI, pregnancy is negative, lipase is normal 32, normal white count 5.1, anemia 9.9, normal  platelet count. Sodium slight low 134, potassium slight low 3.2, normal creatinine 0.87, normal LFTs  Radiology: ordered.    Details: CT ordered and pending read, no free air by me   Risk Prescription drug management. Parenteral controlled substances.    Final Clinical Impression(s) / ED Diagnoses Final diagnoses:  Pelvic pain   Signed out to Dr. Jeraldine Loots pending CT Rx / DC Orders ED Discharge Orders     None         Galen Russman, MD 09/02/23 [phone]

## 2023-09-02 NOTE — ED Provider Notes (Signed)
9:06 AM I assumed care of the patient at signout.  I have previously spoken with her, and now on repeat exam she remains in no distress, awake, alert, sitting upright.  In the interval I discussed the patient's case with her OB doctor, Dr. Cherly Hensen.  I also spoke with our interventional radiology team.  Patient's findings today notable for persistent, possibly smaller lesion possible abscess, periuterine.  This lesion is not a candidate for IR drain placement.  Patient will start antibiotics, and I reviewed the patient's last infectious disease chart for same regimen as she was on previously with similar condition.  Vital signs as above unremarkable, labs notable for being normal, no leukocytosis, and absent fever, hypotension, notes for bacteremia, sepsis.  Patient will continue antibiotics, will continue her follow-up as scheduled in 48 hours with her OB team, and will inform her IVF team of today's findings.  Return precautions also discussed.   Gerhard Munch, MD 09/02/23 [phone]

## 2023-09-03 ENCOUNTER — Telehealth: Payer: Self-pay | Admitting: Cardiovascular Disease

## 2023-09-03 NOTE — Telephone Encounter (Signed)
Mychart message sent, see below   Hello Ms H[place]efah, Hope you have a home blood pressure machine? If so I would recommend checking your blood pressure twice a day for 1 week and send in readings. Send the readings once you have them. I can get you in with Ronn Melena NP that works with Dr Duke Salvia in her hypertension clinic if you would like. Do not think either of them have anything before the beginning of the year Just let me know   Juliette Alcide

## 2023-09-03 NOTE — Telephone Encounter (Signed)
This was sent Via Mychart to the scheduling pool:      Hello. Latest readings were 152/95, 167/100 (both of those were while i was in ER for unrelated pain). Others have been around 145-150/ 90-101. I don't experience associated headaches often. I have family history of high BP and I'm starting a fertility journey soon. I've had multiple doctors ask me if I'm on meds for BP after getting readings. I don't want to ignore that. If Dr. Duke Salvia also has concerns, I am open to  ways to address this without meds but I'll leave that for her expertise .     Good afternoon,  Can you please answer the following questions for me?   1. What are your last 5 blood pressure readings?   2. Are you having any other symptoms (ex. Dizziness, headache, blurred vision, passed out)?   3. What is your BP issue?       Appointment Request From: April Holland  With Provider: Chilton Si [facility] [address]]  Preferred Date Range: 09/05/2023 - 10/10/2023  Preferred Times: Thursday Morning, Thursday Afternoon, Friday Morning, Friday Afternoon  Reason for visit: Office Visit  Comments: My blood pressure has been up fairly consistently. I even switched to decaf coffee most days. I need to lose weight and starting to get back into a routine.

## 2023-09-04 NOTE — Telephone Encounter (Signed)
I do have opening 11/21 for HTN clinic or okay to schedule her on a HTN day in 1 pm slot so we get her in sooner. If those days/times do not work, could always bring her in for nurse visit BP check and have her bring her cuff.  Alver Sorrow, NP

## 2023-09-04 NOTE — Telephone Encounter (Signed)
Patient responded will get blood pressure machine and start monitoring blood pressure  Scheduled her appointment, next available with Ronn Melena 1/23  Messaged patient, gave appointment dated and time, and asked that she send in readings

## 2023-09-11 ENCOUNTER — Other Ambulatory Visit (HOSPITAL_BASED_OUTPATIENT_CLINIC_OR_DEPARTMENT_OTHER): Payer: Self-pay

## 2023-09-11 ENCOUNTER — Encounter (HOSPITAL_BASED_OUTPATIENT_CLINIC_OR_DEPARTMENT_OTHER): Payer: Self-pay | Admitting: Family

## 2023-09-11 ENCOUNTER — Ambulatory Visit (HOSPITAL_BASED_OUTPATIENT_CLINIC_OR_DEPARTMENT_OTHER): Payer: No Typology Code available for payment source | Admitting: Family

## 2023-09-11 VITALS — BP 140/93 | HR 68 | Ht 68.0 in | Wt 209.5 lb

## 2023-09-11 DIAGNOSIS — Z01810 Encounter for preprocedural cardiovascular examination: Secondary | ICD-10-CM

## 2023-09-11 DIAGNOSIS — I10 Essential (primary) hypertension: Secondary | ICD-10-CM | POA: Diagnosis not present

## 2023-09-11 MED ORDER — AMLODIPINE BESYLATE 5 MG PO TABS
5.0000 mg | ORAL_TABLET | Freq: Every day | ORAL | 2 refills | Status: DC
  Filled 2023-09-11: qty 30, 30d supply, fill #0
  Filled 2023-11-23 – 2023-12-29 (×2): qty 30, 30d supply, fill #1

## 2023-09-11 MED ORDER — BLOOD PRESSURE MONITOR MISC
0 refills | Status: AC
  Filled 2023-09-11: qty 1, 1d supply, fill #0

## 2023-09-11 NOTE — Patient Instructions (Signed)
Medication Instructions:  Your physician has recommended you make the following change in your medication:  START Amlodipine 5 mg daily   *If you need a refill on your cardiac medications before your next appointment, please call your pharmacy*  Lab Work: BMP in 1-2 weeks  If you have labs (blood work) drawn today and your tests are completely normal, you will receive your results only by: MyChart Message (if you have MyChart) OR A paper copy in the mail If you have any lab test that is abnormal or we need to change your treatment, we will call you to review the results.  Follow-Up: At [facility], you and your health needs are our priority.  As part of our continuing mission to provide you with exceptional heart care, we have created designated Provider Care Teams.  These Care Teams include your primary Cardiologist (physician) and Advanced Practice Providers (APPs -  Physician Assistants and Nurse Practitioners) who all work together to provide you with the care you need, when you need it.  We recommend signing up for the patient portal called "MyChart".  Sign up information is provided on this After Visit Summary.  MyChart is used to connect with patients for Virtual Visits (Telemedicine).  Patients are able to view lab/test results, encounter notes, upcoming appointments, etc.  Non-urgent messages can be sent to your provider as well.   To learn more about what you can do with MyChart, go to [email or web address].    Your next appointment:   2 month(s)  [facility]  Provider:   Gillian Shields, NP    Other Instructions We will send you a MyChart message to follow up and check on your blood pressures!

## 2023-09-11 NOTE — Progress Notes (Signed)
[facility] Assessment:    Date:  09/11/2023   ID:  April, Holland [birth year], MRN [id number]  PCP:  Collene Mares, PA  Cardiologist:  None  Nephrologist:  Referring MD: Collene Mares, PA   CC: Hypertension  History of Present Illness:    April Holland is a 37 y.o. female with a hx of hypertension here to follow up  in the [facility].   Established with [facility] 08/2021 after referral from OBGYN. Prior exercise stress test 02/2021 was negative. Previously on Labetolol, Amlodipine but was later able to control BP with weight loss and lifestyle changes. Noted that would likely require antihypertensive agent given her family history.   Discussed the use of AI scribe software for clinical note transcription with the patient, who gave verbal consent to proceed.    Pleasant lady presents with concerns about fluctuating blood pressure. She has noticed her blood pressure creeping up again, with the highest readings in the 160s. The patient has been dealing with significant pain due to ovarian cysts, which have led to hospitalizations and the use of antibiotics. She has been to the emergency room three times due to the pain. The patient acknowledges that the pain could be contributing to the elevated blood pressure. She has not been able to return to her normal exercise routine due to the ongoing ordeal with the ovarian cysts. The patient is not currently on any blood pressure medication. The lowest blood pressure reading she has seen recently was 132/78 after being given fentanyl in the hospital.      Previous antihypertensives: Amlodipine  Labetolol   Past Medical History:  Diagnosis Date   Essential hypertension 09/17/2021   Family history of other endocrine, nutritional and metabolic diseases 02/27/2022   Hypertension    Prediabetes    Rupture of ovarian cyst     Past Surgical History:  Procedure Laterality Date    DILATATION & CURETTAGE/HYSTEROSCOPY WITH MYOSURE N/A 08/09/2020   Procedure: DILATATION & CURETTAGE/HYSTEROSCOPY WITH MYOSURE;  Surgeon: Gerald Leitz, MD;  Location:  [facility];  Service: Gynecology;  Laterality: N/A;   HYSTEROSCOPY  2022   WISDOM TOOTH EXTRACTION      Current Medications: Current Meds  Medication Sig   acetaminophen (TYLENOL) 325 MG tablet Take 325-650 mg by mouth every 4 (four) hours as needed.   Boric Acid Vaginal 600 MG SUPP Place 600 mg vaginally daily as needed (as needed).   ibuprofen (ADVIL) 800 MG tablet Take 800 mg by mouth every 6 (six) hours as needed.   Ibuprofen-Acetaminophen (ADVIL DUAL ACTION) 125-250 MG TABS Take 125-250 mg by mouth daily.   oxyCODONE (OXY IR/ROXICODONE) 5 MG immediate release tablet Take 5 mg by mouth every 6 (six) hours as needed.   [DISCONTINUED] oxyCODONE-acetaminophen (PERCOCET/ROXICET) 5-325 MG tablet Take 1 tablet by mouth every 6 (six) hours as needed for severe pain.     Allergies:   Elemental sulfur, Sulfa antibiotics, and Sulfamethoxazole   Social History   Socioeconomic History   Marital status: Unknown    Spouse name: Not on file   Number of children: Not on file   Years of education: Not on file   Highest education level: Not on file  Occupational History   Not on file  Tobacco Use   Smoking status: Never    Passive exposure: Never   Smokeless tobacco: Never  Vaping Use   Vaping status: Never Used  Substance and Sexual Activity  Alcohol use: Not Currently    Comment: social   Drug use: No   Sexual activity: Yes  Other Topics Concern   Not on file  Social History Narrative   Not on file   Social Determinants of Health   Financial Resource Strain: Low Risk  (09/17/2021)   Overall Financial Resource Strain (CARDIA)    Difficulty of Paying Living Expenses: Not very hard  Food Insecurity: No Food Insecurity (07/15/2022)   Hunger Vital Sign    Worried About Running Out of Food in the Last  Year: Never true    Ran Out of Food in the Last Year: Never true  Transportation Needs: No Transportation Needs (07/15/2022)   PRAPARE - Administrator, Civil Service (Medical): No    Lack of Transportation (Non-Medical): No  Physical Activity: Inactive (09/17/2021)   Exercise Vital Sign    Days of Exercise per Week: 0 days    Minutes of Exercise per Session: 0 min  Stress: Not on file  Social Connections: Not on file     Family History: The patient's family history includes Breast cancer in her maternal grandfather; Crohn's disease in her mother; Diabetes in her maternal grandfather; Heart failure in her maternal grandfather; High Cholesterol in her father; Hypertension in her father; Stroke in her maternal grandmother and paternal grandmother; Thyroid disease in her mother.  ROS:   Please see the history of present illness.     All other systems reviewed and are negative.  EKGs/Labs/Other Studies Reviewed:    EKG Interpretation Date/Time:  Thursday September 11 2023 14:40:13 EST Ventricular Rate:  68 PR Interval:  164 QRS Duration:  72 QT Interval:  406 QTC Calculation: 431 R Axis:   42  Text Interpretation: Normal sinus rhythm Normal ECG Confirmed by Gillian Shields ([id number]) on 09/11/2023 2:57:26 PM   Recent Labs: 09/01/2023: ALT 39; BUN 8; Creatinine, Ser 0.87; Hemoglobin 9.9; Platelets 326; Potassium 3.3; Sodium 134   Recent Lipid Panel No results found for: "CHOL", "TRIG", "HDL", "CHOLHDL", "VLDL", "LDLCALC", "LDLDIRECT"  Physical Exam:   VS:  BP (!) 140/93 (BP Location: Left Arm, Patient Position: Sitting, Cuff Size: Normal)   Pulse 68   Ht 5\' 8"  (1.727 m)   Wt 209 lb 8 oz (95 kg)   LMP 08/21/2023   SpO2 99%   BMI 31.85 kg/m  , BMI Body mass index is 31.85 kg/m. GENERAL:  Well appearing HEENT: Pupils equal round and reactive, fundi not visualized, oral mucosa unremarkable NECK:  No jugular venous distention, waveform within normal limits, carotid  upstroke brisk and symmetric, no bruits, no thyromegaly LYMPHATICS:  No cervical adenopathy LUNGS:  Clear to auscultation bilaterally HEART:  RRR.  PMI not displaced or sustained,S1 and S2 within normal limits, no S3, no S4, no clicks, no rubs, no murmurs ABD:  Flat, positive bowel sounds normal in frequency in pitch, no bruits, no rebound, no guarding, no midline pulsatile mass, no hepatomegaly, no splenomegaly EXT:  2 plus pulses throughout, no edema, no cyanosis no clubbing SKIN:  No rashes no nodules NEURO:  Cranial nerves II through XII grossly intact, motor grossly intact throughout PSYCH:  Cognitively intact, oriented to person place and time   ASSESSMENT/PLAN:       Hypertension Elevated blood pressure readings noted, likely exacerbated by pain from ovarian cysts and inconsistent exercise routine. No current antihypertensive medication. Family history of hypertension. Lowest recent reading was 132/78 after administration of fentanyl. -Start Amlodipine 5mg  daily. -Update BMP today. -  Prefers to utilize pregnancy safe medications for the future. Future considerations include increased dose Amlodipine, transition to Nifedipine, or Labetolol. -Purchase a home blood pressure cuff and monitor blood pressure regularly. -Check blood pressure in 1 week and communicate results via MyChart.  Ovarian Cysts/Preop History of recurrent ovarian cysts causing significant pain, leading to multiple ER visits and hospitalizations. Plan for laparoscopy at [facility] [place] after follow-up in January. -Continue current management plan with OB/GYN. -Exercise tolerance > 4 METS. Per AHA/ACC guidelines, she is deemed acceptable risk for the planned procedure without additional cardiovascular testing. Will route to surgical team so they are aware.         Screening for Secondary Hypertension:     09/17/2021    3:06 PM  Causes  Drugs/Herbals Screened     - Comments limiting salt.  1 coffee  daily. iron supplement, occasional EtOH  Renovascular HTN N/A  Sleep Apnea Screened     - Comments no symptoms  Thyroid Disease Screened  Hyperaldosteronism N/A  Pheochromocytoma N/A  Cushing's Syndrome N/A  Hyperparathyroidism N/A  Coarctation of the Aorta Screened  Compliance Screened     - Comments ran out of BP medication    Relevant Labs/Studies:    Latest Ref Rng & Units 09/01/2023    6:33 PM 02/19/2023    4:38 AM 10/10/2022    3:26 PM  Basic Labs  Sodium 135 - 145 mmol/L 134  136  136   Potassium 3.5 - 5.1 mmol/L 3.3  3.3  3.9   Creatinine 0.44 - 1.00 mg/dL 2.84  1.32  4.40                     Disposition:    FU with MD/PharmD in 2 months    Medication Adjustments/Labs and Tests Ordered: Current medicines are reviewed at length with the patient today.  Concerns regarding medicines are outlined above.  Orders Placed This Encounter  Procedures   EKG 12-Lead   No orders of the defined types were placed in this encounter.    Signed, Alver Sorrow, NP  09/11/2023 2:53 PM    [facility]

## 2023-09-16 NOTE — Telephone Encounter (Signed)
Patient seen in clinic as scheduled.

## 2023-11-13 ENCOUNTER — Encounter (HOSPITAL_BASED_OUTPATIENT_CLINIC_OR_DEPARTMENT_OTHER): Payer: No Typology Code available for payment source | Admitting: Family

## 2023-11-24 ENCOUNTER — Encounter: Payer: Self-pay | Admitting: Pharmacist

## 2023-11-24 ENCOUNTER — Other Ambulatory Visit: Payer: Self-pay

## 2023-11-24 ENCOUNTER — Other Ambulatory Visit (HOSPITAL_COMMUNITY): Payer: Self-pay

## 2023-12-29 ENCOUNTER — Other Ambulatory Visit (HOSPITAL_BASED_OUTPATIENT_CLINIC_OR_DEPARTMENT_OTHER): Payer: Self-pay

## 2023-12-31 ENCOUNTER — Encounter (HOSPITAL_BASED_OUTPATIENT_CLINIC_OR_DEPARTMENT_OTHER): Payer: Self-pay

## 2024-01-01 ENCOUNTER — Ambulatory Visit (HOSPITAL_BASED_OUTPATIENT_CLINIC_OR_DEPARTMENT_OTHER): Payer: No Typology Code available for payment source | Admitting: Family

## 2024-01-01 ENCOUNTER — Other Ambulatory Visit (HOSPITAL_BASED_OUTPATIENT_CLINIC_OR_DEPARTMENT_OTHER): Payer: Self-pay | Admitting: Internal Medicine

## 2024-01-01 VITALS — BP 132/82 | HR 81 | Ht 68.0 in | Wt 208.0 lb

## 2024-01-01 DIAGNOSIS — R519 Headache, unspecified: Secondary | ICD-10-CM

## 2024-01-01 DIAGNOSIS — I10 Essential (primary) hypertension: Secondary | ICD-10-CM

## 2024-01-01 MED ORDER — AMLODIPINE BESYLATE 10 MG PO TABS: 10.0000 mg | ORAL_TABLET | Freq: Every day | ORAL | 3 refills | Status: DC

## 2024-01-01 NOTE — Progress Notes (Signed)
 Advanced Hypertension [facility] Assessment:    Date:  01/01/2024   ID:  April Holland, April Holland [birth year], MRN [id number]  PCP:  April Mares, PA  Cardiologist:  None  Nephrologist:  Referring MD: April Mares, PA   CC: Hypertension  History of Present Illness:    April Holland is a 38 y.o. female with a hx of hypertension here to follow up  in the [facility].   Established with [facility] 08/2021 after referral from OBGYN. Prior exercise stress test 02/2021 was negative. Previously on Labetolol, Amlodipine but was later able to control BP with weight loss and lifestyle changes. Noted that would likely require antihypertensive agent given her family history.   Last seen 09/11/23 with concerns about fluctuating blood pressure in setting of significant pain due to ovarian cysts.  Amlodipine 5 mg daily initiated.  Presents today for follow-up.  Since last seen had laparoscopic removal of ovarian cysts at [facility].  She is working to get back to an exercise routine as she continues to recover. BP at home has been 140-150. THis morning 147/95.  BP This morning at PCP 138/80.   Previous antihypertensives: Amlodipine  Labetolol   Past Medical History:  Diagnosis Date   Essential hypertension 09/17/2021   Family history of other endocrine, nutritional and metabolic diseases 02/27/2022   Hypertension    Prediabetes    Rupture of ovarian cyst     Past Surgical History:  Procedure Laterality Date   DILATATION & CURETTAGE/HYSTEROSCOPY WITH MYOSURE N/A 08/09/2020   Procedure: DILATATION & CURETTAGE/HYSTEROSCOPY WITH MYOSURE;  Surgeon: April Leitz, MD;  Location: [facility];  Service: Gynecology;  Laterality: N/A;   HYSTEROSCOPY  2022   WISDOM TOOTH EXTRACTION      Current Medications: Current Meds  Medication Sig   acetaminophen (TYLENOL) 325 MG tablet Take 325-650 mg by mouth every 4 (four) hours as needed.    amLODipine (NORVASC) 5 MG tablet Take 1 tablet (5 mg total) by mouth daily.   Blood Pressure Monitor MISC Check blood pressure as instructed by your physician   Boric Acid Vaginal 600 MG SUPP Place 600 mg vaginally daily as needed (as needed).   ibuprofen (ADVIL) 800 MG tablet Take 800 mg by mouth every 6 (six) hours as needed.     Allergies:   Elemental sulfur, Sulfa antibiotics, and Sulfamethoxazole   Social History   Socioeconomic History   Marital status: Unknown    Spouse name: Not on file   Number of children: Not on file   Years of education: Not on file   Highest education level: Not on file  Occupational History   Not on file  Tobacco Use   Smoking status: Never    Passive exposure: Never   Smokeless tobacco: Never  Vaping Use   Vaping status: Never Used  Substance and Sexual Activity   Alcohol use: Not Currently    Comment: social   Drug use: No   Sexual activity: Yes  Other Topics Concern   Not on file  Social History Narrative   Not on file   Social Drivers of Health   Financial Resource Strain: Low Risk  (09/17/2021)   Overall Financial Resource Strain (CARDIA)    Difficulty of Paying Living Expenses: Not very hard  Food Insecurity: No Food Insecurity (07/15/2022)   Hunger Vital Sign    Worried About Running Out of Food in the Last Year: Never true    Ran  Out of Food in the Last Year: Never true  Transportation Needs: No Transportation Needs (07/15/2022)   PRAPARE - Administrator, Civil Service (Medical): No    Lack of Transportation (Non-Medical): No  Physical Activity: Inactive (09/17/2021)   Exercise Vital Sign    Days of Exercise per Week: 0 days    Minutes of Exercise per Session: 0 min  Stress: Not on file  Social Connections: Not on file     Family History: The patient's family history includes Breast cancer in her maternal grandfather; Crohn's disease in her mother; Diabetes in her maternal grandfather; Heart failure in her  maternal grandfather; High Cholesterol in her father; Hypertension in her father; Stroke in her maternal grandmother and paternal grandmother; Thyroid disease in her mother.  ROS:   Please see the history of present illness.     All other systems reviewed and are negative.  EKGs/Labs/Other Studies Reviewed:        Recent Labs: 09/01/2023: ALT 39; BUN 8; Creatinine, Ser 0.87; Hemoglobin 9.9; Platelets 326; Potassium 3.3; Sodium 134   Recent Lipid Panel No results found for: "CHOL", "TRIG", "HDL", "CHOLHDL", "VLDL", "LDLCALC", "LDLDIRECT"  Physical Exam:   VS:  BP 137/88 (BP Location: Left Arm, Patient Position: Sitting, Cuff Size: Normal)   Pulse 81   Ht 5\' 8"  (1.727 m)   Wt 208 lb (94.3 kg)   BMI 31.63 kg/m  , BMI Body mass index is 31.63 kg/m. GENERAL:  Well appearing HEENT: Pupils equal round and reactive, fundi not visualized, oral mucosa unremarkable NECK:  No jugular venous distention, waveform within normal limits, carotid upstroke brisk and symmetric, no bruits, no thyromegaly LYMPHATICS:  No cervical adenopathy LUNGS:  Clear to auscultation bilaterally HEART:  RRR.  PMI not displaced or sustained,S1 and S2 within normal limits, no S3, no S4, no clicks, no rubs, no murmurs ABD:  Flat, positive bowel sounds normal in frequency in pitch, no bruits, no rebound, no guarding, no midline pulsatile mass, no hepatomegaly, no splenomegaly EXT:  2 plus pulses throughout, no edema, no cyanosis no clubbing SKIN:  No rashes no nodules NEURO:  Cranial nerves II through XII grossly intact, motor grossly intact throughout PSYCH:  Cognitively intact, oriented to person place and time   ASSESSMENT/PLAN:       Hypertension Not yet at goal less than 130/80. -Increase amlodipine to 10 mg daily.  Anticipate will be able to reduce dose in the future as she returns to more exercise after recovery from her recent laparoscopic procedure. -Prefers to utilize pregnancy safe medications for the  future. Future considerations include increased dose Amlodipine, transition to Nifedipine, or Labetolol. -Discussed to monitor BP at home at least 2 hours after medications and sitting for 5-10 minutes.    Screening for Secondary Hypertension:     09/17/2021    3:06 PM  Causes  Drugs/Herbals Screened     - Comments limiting salt.  1 coffee daily. iron supplement, occasional EtOH  Renovascular HTN N/A  Sleep Apnea Screened     - Comments no symptoms  Thyroid Disease Screened  Hyperaldosteronism N/A  Pheochromocytoma N/A  Cushing's Syndrome N/A  Hyperparathyroidism N/A  Coarctation of the Aorta Screened  Compliance Screened     - Comments ran out of BP medication    Relevant Labs/Studies:    Latest Ref Rng & Units 09/01/2023    6:33 PM 02/19/2023    4:38 AM 10/10/2022    3:26 PM  Basic Labs  Sodium  135 - 145 mmol/L 134  136  136   Potassium 3.5 - 5.1 mmol/L 3.3  3.3  3.9   Creatinine 0.44 - 1.00 mg/dL 9.52  8.41  3.24                     Disposition:    FU with MD/PharmD in 2-3 months    Medication Adjustments/Labs and Tests Ordered: Current medicines are reviewed at length with the patient today.  Concerns regarding medicines are outlined above.  No orders of the defined types were placed in this encounter.  No orders of the defined types were placed in this encounter.    Signed, Alver Sorrow, NP  01/01/2024 3:39 PM    [facility]

## 2024-01-01 NOTE — Patient Instructions (Addendum)
 Medication Instructions:  CHANGE Amlodipine to 10mg  daily    Follow-Up: In 2-3 months with [facility]  with Dr. Duke Salvia, Gillian Shields, NP or Phillips Hay

## 2024-01-03 ENCOUNTER — Encounter (HOSPITAL_BASED_OUTPATIENT_CLINIC_OR_DEPARTMENT_OTHER): Payer: Self-pay | Admitting: Family

## 2024-01-04 ENCOUNTER — Ambulatory Visit (HOSPITAL_BASED_OUTPATIENT_CLINIC_OR_DEPARTMENT_OTHER)
Admission: RE | Admit: 2024-01-04 | Discharge: 2024-01-04 | Disposition: A | Discharge status: Home / Self Care | Source: Ambulatory Visit | Attending: Internal Medicine | Admitting: Internal Medicine

## 2024-01-04 DIAGNOSIS — R519 Headache, unspecified: Secondary | ICD-10-CM | POA: Insufficient documentation

## 2024-01-04 DIAGNOSIS — I10 Essential (primary) hypertension: Secondary | ICD-10-CM | POA: Insufficient documentation

## 2024-03-04 ENCOUNTER — Other Ambulatory Visit: Payer: Self-pay | Admitting: Internal Medicine

## 2024-03-04 ENCOUNTER — Encounter (HOSPITAL_BASED_OUTPATIENT_CLINIC_OR_DEPARTMENT_OTHER): Admitting: Family

## 2024-03-04 DIAGNOSIS — Z1231 Encounter for screening mammogram for malignant neoplasm of breast: Secondary | ICD-10-CM

## 2024-03-09 ENCOUNTER — Encounter (HOSPITAL_BASED_OUTPATIENT_CLINIC_OR_DEPARTMENT_OTHER): Payer: Self-pay | Admitting: Radiology

## 2024-03-09 ENCOUNTER — Ambulatory Visit (HOSPITAL_BASED_OUTPATIENT_CLINIC_OR_DEPARTMENT_OTHER)
Admission: RE | Admit: 2024-03-09 | Discharge: 2024-03-09 | Disposition: A | Discharge status: Home / Self Care | Source: Ambulatory Visit

## 2024-03-09 DIAGNOSIS — Z1231 Encounter for screening mammogram for malignant neoplasm of breast: Secondary | ICD-10-CM | POA: Insufficient documentation

## 2024-03-11 ENCOUNTER — Emergency Department (HOSPITAL_BASED_OUTPATIENT_CLINIC_OR_DEPARTMENT_OTHER)

## 2024-03-11 ENCOUNTER — Encounter (HOSPITAL_BASED_OUTPATIENT_CLINIC_OR_DEPARTMENT_OTHER): Payer: Self-pay

## 2024-03-11 ENCOUNTER — Other Ambulatory Visit: Payer: Self-pay

## 2024-03-11 ENCOUNTER — Emergency Department (HOSPITAL_BASED_OUTPATIENT_CLINIC_OR_DEPARTMENT_OTHER)
Admission: EM | Admit: 2024-03-11 | Discharge: 2024-03-11 | Disposition: A | Discharge status: Home / Self Care | Attending: Emergency Medicine | Admitting: Emergency Medicine

## 2024-03-11 DIAGNOSIS — R102 Pelvic and perineal pain: Secondary | ICD-10-CM | POA: Diagnosis not present

## 2024-03-11 DIAGNOSIS — I10 Essential (primary) hypertension: Secondary | ICD-10-CM | POA: Insufficient documentation

## 2024-03-11 DIAGNOSIS — N736 Female pelvic peritoneal adhesions (postinfective): Secondary | ICD-10-CM

## 2024-03-11 DIAGNOSIS — Z79899 Other long term (current) drug therapy: Secondary | ICD-10-CM | POA: Diagnosis not present

## 2024-03-11 DIAGNOSIS — R109 Unspecified abdominal pain: Secondary | ICD-10-CM | POA: Diagnosis present

## 2024-03-11 HISTORY — DX: Endometriosis, unspecified: N80.9

## 2024-03-11 LAB — CBC
HCT: 25.9 % — ABNORMAL LOW (ref 36.0–46.0)
Hemoglobin: 7.9 g/dL — ABNORMAL LOW (ref 12.0–15.0)
MCH: 24.5 pg — ABNORMAL LOW (ref 26.0–34.0)
MCHC: 30.5 g/dL (ref 30.0–36.0)
MCV: 80.2 fL (ref 80.0–100.0)
Platelets: 295 10*3/uL (ref 150–400)
RBC: 3.23 MIL/uL — ABNORMAL LOW (ref 3.87–5.11)
RDW: 14.6 % (ref 11.5–15.5)
WBC: 4.8 10*3/uL (ref 4.0–10.5)
nRBC: 0 % (ref 0.0–0.2)

## 2024-03-11 LAB — URINALYSIS, ROUTINE W REFLEX MICROSCOPIC
Bilirubin Urine: NEGATIVE
Glucose, UA: NEGATIVE mg/dL
Hgb urine dipstick: NEGATIVE
Ketones, ur: NEGATIVE mg/dL
Leukocytes,Ua: NEGATIVE
Nitrite: NEGATIVE
Specific Gravity, Urine: 1.029 (ref 1.005–1.030)
pH: 7.5 (ref 5.0–8.0)

## 2024-03-11 LAB — COMPREHENSIVE METABOLIC PANEL WITH GFR
ALT: 41 U/L (ref 0–44)
AST: 28 U/L (ref 15–41)
Albumin: 4.4 g/dL (ref 3.5–5.0)
Alkaline Phosphatase: 57 U/L (ref 38–126)
Anion gap: 12 (ref 5–15)
BUN: 9 mg/dL (ref 6–20)
CO2: 24 mmol/L (ref 22–32)
Calcium: 9.4 mg/dL (ref 8.9–10.3)
Chloride: 103 mmol/L (ref 98–111)
Creatinine, Ser: 0.81 mg/dL (ref 0.44–1.00)
GFR, Estimated: >60 mL/min (ref >60)
Glucose, Bld: 109 mg/dL — ABNORMAL HIGH (ref 70–99)
Potassium: 3.8 mmol/L (ref 3.5–5.1)
Sodium: 138 mmol/L (ref 135–145)
Total Bilirubin: 0.3 mg/dL (ref 0.0–1.2)
Total Protein: 7.3 g/dL (ref 6.5–8.1)

## 2024-03-11 LAB — LIPASE, BLOOD: Lipase: 42 U/L (ref 11–51)

## 2024-03-11 LAB — PREGNANCY, URINE: Preg Test, Ur: NEGATIVE

## 2024-03-11 MED ORDER — LACTATED RINGERS IV SOLN: INTRAVENOUS | Status: DC

## 2024-03-11 MED ORDER — LACTATED RINGERS IV BOLUS
1000.0000 mL | Freq: Once | INTRAVENOUS | Status: AC
  Administered 2024-03-11: 1000 mL via INTRAVENOUS

## 2024-03-11 MED ORDER — FENTANYL CITRATE PF 50 MCG/ML IJ SOSY
50.0000 ug | PREFILLED_SYRINGE | Freq: Once | INTRAMUSCULAR | Status: AC
  Administered 2024-03-11: 50 ug via INTRAVENOUS
  Filled 2024-03-11: qty 1

## 2024-03-11 MED ORDER — OXYCODONE HCL 5 MG PO TABS: 2.5000 mg | ORAL_TABLET | Freq: Four times a day (QID) | ORAL | 0 refills | Status: AC | PRN

## 2024-03-11 MED ORDER — ONDANSETRON HCL 4 MG PO TABS: 4.0000 mg | ORAL_TABLET | Freq: Three times a day (TID) | ORAL | 0 refills | Status: AC | PRN

## 2024-03-11 MED ORDER — ONDANSETRON HCL 4 MG/2ML IJ SOLN
4.0000 mg | Freq: Once | INTRAMUSCULAR | Status: AC
  Administered 2024-03-11: 4 mg via INTRAVENOUS
  Filled 2024-03-11: qty 2

## 2024-03-11 MED ORDER — NAPROXEN 375 MG PO TABS: 375.0000 mg | ORAL_TABLET | Freq: Two times a day (BID) | ORAL | 0 refills | Status: AC

## 2024-03-11 NOTE — ED Provider Notes (Signed)
 [place] EMERGENCY DEPARTMENT AT [facility] Provider Note   CSN: [id number] Arrival date & time: 03/11/24  1702     History  Chief Complaint  Patient presents with   Flank Pain    April Holland is a 38 y.o. female who presents the emergency department with a chief complaint of flank pain.  She has a past medical [place] history of endometriosis and adhesion lysis.  She reports ongoing right sided flank pain for the last month that sometimes starts in her flank and radiates forward and sometimes starts in her abdomen and radiates toward the right flank.  It is not worsened by any activity or position.  She reports the pain is stabbing.  Previously it was intermittent.  More recently has become more constant where she has had to take round-the-clock Tylenol  and Advil  for pain control.  It woke her from sleep last night and was severe she had associated retching it began to subside however it returned today at work and she came in for further evaluation.  She states that sometimes the pain is somewhat relieved after she urinates.  She denies a history of kidney stones.  LMP was 02/23/2024.  She denies fevers or hematuria.   Flank Pain       Home Medications Prior to Admission medications   Medication Sig Start Date End Date Taking? Authorizing Provider  acetaminophen  (TYLENOL ) 325 MG tablet Take 325-650 mg by mouth every 4 (four) hours as needed.    [provider]  amLODipine  (NORVASC ) 10 MG tablet Take 1 tablet (10 mg total) by mouth daily. 01/01/24 03/31/24  Clearnce Curia, NP  Blood Pressure Monitor MISC Check blood pressure as instructed by your physician 09/11/23   Walker, Caitlin S, NP  Boric Acid Vaginal 600 MG SUPP Place 600 mg vaginally daily as needed (as needed). 07/16/18   [provider]  ibuprofen  (ADVIL ) 800 MG tablet Take 800 mg by mouth every 6 (six) hours as needed.    [provider]      Allergies    Elemental sulfur,  Sulfa antibiotics, and Sulfamethoxazole    Review of Systems   Review of Systems  Genitourinary:  Positive for flank pain.    Physical Exam Updated Vital Signs BP (!) 151/93   Pulse 95   Temp 98.1 F (36.7 C)   Resp 18   Ht 5\' 8"  (1.727 m)   Wt 96.2 kg   LMP 02/23/2024 (Exact Date)   SpO2 100%   BMI 32.23 kg/m  Physical Exam Vitals and nursing note reviewed.  Constitutional:      General: She is not in acute distress.    Appearance: She is well-developed. She is not diaphoretic.  HENT:     Head: Normocephalic and atraumatic.     Right Ear: External ear normal.     Left Ear: External ear normal.     Nose: Nose normal.     Mouth/Throat:     Mouth: Mucous membranes are moist.  Eyes:     General: No scleral icterus.    Conjunctiva/sclera: Conjunctivae normal.  Cardiovascular:     Rate and Rhythm: Normal rate and regular rhythm.     Heart sounds: Normal heart sounds. No murmur heard.    No friction rub. No gallop.  Pulmonary:     Effort: Pulmonary effort is normal. No respiratory distress.     Breath sounds: Normal breath sounds.  Abdominal:     General: Bowel sounds are normal.  There is no distension.     Palpations: Abdomen is soft. There is no mass.     Tenderness: There is no abdominal tenderness. There is no right CVA tenderness, left CVA tenderness or guarding.  Musculoskeletal:     Cervical back: Normal range of motion.  Skin:    General: Skin is warm and dry.  Neurological:     Mental Status: She is alert and oriented to person, place, and time.  Psychiatric:        Behavior: Behavior normal.     ED Results / Procedures / Treatments   Labs (all labs ordered are listed, but only abnormal results are displayed) Labs Reviewed  COMPREHENSIVE METABOLIC PANEL WITH GFR - Abnormal; Notable for the following components:      Result Value   Glucose, Bld 109 (*)    All other components within normal limits  CBC - Abnormal; Notable for the following components:    RBC 3.23 (*)    Hemoglobin 7.9 (*)    HCT 25.9 (*)    MCH 24.5 (*)    All other components within normal limits  URINALYSIS, ROUTINE W REFLEX MICROSCOPIC - Abnormal; Notable for the following components:   Protein, ur TRACE (*)    All other components within normal limits  LIPASE, BLOOD  PREGNANCY, URINE    EKG None  Radiology No results found.  Procedures Procedures    Medications Ordered in ED Medications  fentaNYL  (SUBLIMAZE ) injection 50 mcg (has no administration in time range)  ondansetron  (ZOFRAN ) injection 4 mg (has no administration in time range)    ED Course/ Medical Decision Making/ A&P Clinical Course as of 03/11/24 2006  Thu Mar 11, 2024  1942 Hemoglobin(!): 7.9 [AH]    Clinical Course User Index [AH] Tama Fails, PA-C  This patient presents to the ED for concern of RLQ/ flank pain, this involves an extensive number of treatment options, and is a complaint that carries with it a high risk of complications and morbidity.  The differential diagnosis of emergent flank pain includes, but is not limited to :Abdominal aortic aneurysm,, Renal artery embolism,Renal vein thrombosis, Aortic dissection, Mesenteric ischemia, Pyelonephritis, Renal infarction, Renal hemorrhage, Nephrolithiasis/ Renal Colic, Bladder tumor,Cystitis, Biliary colic, Pancreatitis Perforated peptic ulcer Appendicitis ,Inguinal Hernia, Diverticulitis, Bowel obstruction  Ovarian torsion)  Shingles Lower lobe pneumonia, Retroperitoneal hematoma/abscess/tumor, Epidural abscess, Epidural hematoma   Co morbidities:  Past Medical History:  Diagnosis Date   Essential hypertension 09/17/2021   Family history of other endocrine, nutritional and metabolic diseases 02/27/2022   Hypertension    Prediabetes    Rupture of ovarian cyst      Social Determinants of Health:   SDOH Screenings   Food Insecurity: No Food Insecurity (07/15/2022)  Housing: Low Risk  (07/15/2022)  Transportation  Needs: No Transportation Needs (07/15/2022)  Utilities: Not At Risk (07/15/2022)  Alcohol Screen: Low Risk  (09/17/2021)  Depression (PHQ2-9): Low Risk  (02/26/2023)  Financial Resource Strain: Low Risk  (09/17/2021)  Physical Activity: Inactive (09/17/2021)  Tobacco Use: Low Risk  (01/03/2024)     Additional history:  {Additional history obtained from patient {External records from outside source obtained and reviewed including previous surgical and follow up notes [facility]  Lab Tests:  I Ordered, and personally interpreted labs.  The pertinent results include:   HGB 7.9 within the realm of patient's regular anemia ( pt has not been taking Iron but will restart) Remainder of labs unremarkable  Imaging Studies:  I ordered imaging studies including  CT renal stone study and pelvic ultrasound I independently visualized and interpreted imaging which showed no evidence of torsion or renal stone.  Patient has persistent collections of fluid within the pelvis seen on previous CT findings I agree with the radiologist interpretation  Cardiac Monitoring/ECG:  The patient was maintained on a cardiac monitor.  I personally viewed and interpreted the cardiac monitored which showed an underlying rhythm of: Sinus rhythm  Medicines ordered and prescription drug management:  I ordered medication including  Medications  fentaNYL  (SUBLIMAZE ) injection 50 mcg (has no administration in time range)  ondansetron  (ZOFRAN ) injection 4 mg (has no administration in time range)   for pain Reevaluation of the patient after these medicines showed that the patient improved I have reviewed the patients home medicines and have made adjustments as needed  Test Considered:    Critical Interventions:    Consultations Obtained:  Case discussed with Dr. Vallarie Gauze as per her note (please see consultation note). Currently patient does not need any treatment for infection.  She will need outpatient follow-up with  [facility] specialist.  She is recommending [facility].  Information given to the patient.  Problem List / ED Course:     ICD-10-CM   1. 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  She has persistent peritoneal adhesions and fluid collections.  Recommend outpatient follow-up with [facility] specialist.  She was given information for self-referral.  Patient will be discharged with pain medications anti-inflammatories and antiemetics.  PDMP reviewed during this encounter.    Dispostion:  After consideration of the diagnostic results and the patients response to treatment, I feel that the patent would benefit from discharge.                                Medical Decision Making Amount and/or Complexity of Data Reviewed Labs: ordered. Decision-making details documented in ED Course. Radiology: ordered.  Risk Prescription drug management.           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

## 2024-03-11 NOTE — Consult Note (Signed)
   OB/GYN Telephone Consult  April Holland is a 38 y.o. No obstetric history on file. presenting with pelvic pain.   I was called for a consult regarding the care of this patient by [facility].    The provider had a clinical question regarding on treating for PID and ongoing management for this patient.   The provider presented the following relevant clinical information and I performed a chart review on the patient and reviewed available documentation:  Patient had a history of PID/TOA and had drains and interventions managed previously by Drs. Yalcinkaya and Donnellson and ID through [place]. She then saw a surgeon at [facility] and she had L/S surgery there to drain the fluid loculations posterior to her uterus. They were not able to drain all the loculations and noted extensive scar tissue. She presents today with right flank pain and pelvic pain. She has a history of endometriosis as well.   I reviewed her pelvic ultrasound images independently and my findings are: consistent with the report. No adnexal masses or cysts. Two fluid loculations posterior to the uterus.   I reviewed CareEverywhere:  The notes from surgery admission 11/28/23. She had extensive adhesiolysis, bilateral partial salpingectomy.   "Extensive pelvic adhesive disease. Fitz-Hugh-Curtis adhesions above liver to diaphragm. Adhesions of omentum to left abdominal sidewall. Adhesions of omentum to anterior abdominal wall. Adhesions from large bowel to left adnexa and posterior uterus. Adhesions between small bowel and posterior uterus. Multiple large peritoneal inclusion cystic spaces between adhesions in both left and right pelvic sidewalls- Entered and drained. Chocolate appearance to cyst on left pelvic sidewall. R fallopian tube with hydrosalpinx and adhered to uterus. L fallopian tube with hydrosalpinx and adhered to uterus, unable to visualize L ovary or fimbriated ends of L fallopian tube due to adhesive disease. R ovary partially  visualized deep in cul-de-sac and adherent to posterior uterus and posterior cul de sac Additional adhesions between uterus and anterior abdominal wall, partially lysed at fundus, however significant adhesions remaining in this space. Endometriosis implants visualized on anterior abdominal wall."  BP 137/88   Pulse 78   Temp 98.6 F (37 C) (Oral)   Resp 18   Ht 5\' 8"  (1.727 m)   Wt 96.2 kg   LMP 02/23/2024 (Exact Date)   SpO2 100%   BMI 32.23 kg/m   Exam- performed by consulting provider   Recommendations:  - Given lack of leukocytosis and no fever and longstanding history of pelvic adhesive disease and surgeries, I would not recommend ABX at this time.  - She can establish with MIGS surgeon locally for ongoing management of the chronic pelvic pain. I recommend [facility].  She may also continue with her surgeon in [place] (she has family there as noted on review of notes).  Address: [address], [place], [place] [address] Phone: [phone]  Thank you for this consult and if additional recommendations are needed please call [phone] for the OB/GYN attending on service at [facility].   Lacey Pian, MD Attending Obstetrician & Gynecologist, [facility] for [facility], [facility]

## 2024-03-11 NOTE — ED Triage Notes (Signed)
 Pt POV reporting R side flank pain that radiates down to pelvis x4 weeks, worsened this week. Hx endometriosis. Afebrile.

## 2024-03-11 NOTE — Discharge Instructions (Signed)
 Contact a health care provider if: You have a fever. You have pain in your belly or pelvis. You throw up. Your belly swells. You're bloated and can't pass gas. You have trouble pooping. Get help right away if: You have very bad pain in your belly or pelvis. The pain in your belly or pelvis gets worse. You throw up each time you eat or drink.

## 2024-03-11 NOTE — ED Notes (Signed)
Reviewed discharge instructions, medications, and home care with pt. Pt verbalized understanding and had no further questions. Pt exited ED without complications.

## 2024-04-28 ENCOUNTER — Encounter (HOSPITAL_BASED_OUTPATIENT_CLINIC_OR_DEPARTMENT_OTHER): Payer: Self-pay | Admitting: Family

## 2024-04-29 ENCOUNTER — Telehealth: Payer: Self-pay

## 2024-04-29 NOTE — Telephone Encounter (Signed)
 Patient called, she received a text saying she has an overdue balance with our office and she wasn't sure if this was a scam. Provided her with phone number for [facility] billing to verify her account status. Asked her to call back with any further issues.   Sible Straley, BSN, RN

## 2024-05-06 ENCOUNTER — Encounter (HOSPITAL_BASED_OUTPATIENT_CLINIC_OR_DEPARTMENT_OTHER): Admitting: Family

## 2024-05-30 NOTE — Therapy (Signed)
 OUTPATIENT PHYSICAL THERAPY THORACOLUMBAR EVALUATION   Patient Name: April Holland MRN: [id number] DOB:03-05-1986, 38 y.o., female Today's Date: 05/31/2024  END OF SESSION:  PT End of Session - 05/31/24 1659     Visit Number 1    Date for PT Re-Evaluation 07/26/24    Authorization Type [employer]    PT Start Time 1621    PT Stop Time 1701    PT Time Calculation (min) 40 min    Activity Tolerance Patient tolerated treatment well    Behavior During Therapy [facility] for tasks assessed/performed          Past Medical History:  Diagnosis Date   Endometriosis    Essential hypertension 09/17/2021   Family history of other endocrine, nutritional and metabolic diseases 02/27/2022   Hypertension    Prediabetes    Rupture of ovarian cyst    Past Surgical History:  Procedure Laterality Date   DILATATION & CURETTAGE/HYSTEROSCOPY WITH MYOSURE N/A 08/09/2020   Procedure: DILATATION & CURETTAGE/HYSTEROSCOPY WITH MYOSURE;  Surgeon: Rosalva Sawyer, MD;  Location: [facility];  Service: Gynecology;  Laterality: N/A;   HYSTEROSCOPY  2022   WISDOM TOOTH EXTRACTION     Patient Active Problem List   Diagnosis Date Noted   Pelvic pain 03/11/2024   Screening for HIV (human immunodeficiency virus) 10/13/2022   Medication monitoring encounter 10/10/2022   Pelvic abscess in female 10/10/2022   Patellofemoral pain syndrome 09/06/2022   Abdominal pain with fever after surgery 07/16/2022   Tubo-ovarian abscess 07/15/2022   Positive ANA (antinuclear antibody) 02/28/2022   Menstrual disorder 02/27/2022   Family history of other endocrine, nutritional and metabolic diseases 02/27/2022   Menorrhagia 02/27/2022   Female infertility 02/27/2022   Prediabetes 02/27/2022   Vitamin D deficiency 02/27/2022   Essential hypertension 09/17/2021   Goiter 01/11/2013    PCP: Cleotilde Pion , PA  REFERRING PROVIDER: Jolie Krabbe, FNP  REFERRING DIAG:  Diagnosis  M62.830 (ICD-10-CM) - Muscle  spasm of back  R10.31 (ICD-10-CM) - Right lower quadrant pain    Rationale for Evaluation and Treatment: Rehabilitation  THERAPY DIAG:  Cramp and spasm - Plan: PT plan of care cert/re-cert  Other low back pain - Plan: PT plan of care cert/re-cert  ONSET DATE: 2 year history   SUBJECTIVE:                                                                                                                                                                                           SUBJECTIVE STATEMENT: I've had an interesting 2 years.  I've been to ED with abdominal pain, had ovarian cyst rupture and have end  stage endometriosis. Had laproscopy and now pelvic pain is much better. I've more recently started experiencing Rt lumbar tension over the past several months.  Pt goes to the chiropractor 1x/month for adjustments and assisted stretching.   PERTINENT HISTORY:  HTN, endometriosis  PAIN: 05/31/24 Are you having pain? Yes: NPRS scale: 0/10 today, intermittent pain up to 3/10 Pain location: Rt lumbar/flank Pain description: pulsating, achy Aggravating factors: long periods of sitting/standing  Relieving factors: change of position, stretching, Ibuprofen /Tylenol , TENs, heat  PRECAUTIONS: None  RED FLAGS: None   WEIGHT BEARING RESTRICTIONS: No  FALLS:  Has patient fallen in last 6 months? Yes. Number of falls tripped on dog- no balance deficits   LIVING ENVIRONMENT: Lives with: lives with their family   OCCUPATION: desk work  PLOF: Independent, Vocation/Vocational requirements: desk work, and Leisure: gym-weights, dance, cooking, gardening   PATIENT GOALS: return to the gym, reduce pain in low back with daily activity  NEXT MD VISIT: MRI has been ordered-for follow-up  OBJECTIVE:  Note: Objective measures were completed at Evaluation unless otherwise noted.  DIAGNOSTIC FINDINGS:  None for low back   PATIENT SURVEYS:  8/11/25Modified Oswestry: 20%  (10/50)  COGNITION: Overall cognitive status: Within functional limits for tasks assessed     SENSATION: WFL  MUSCLE LENGTH: Rt hamstring flexibility limited by 25% vs the Lt.  All other hip flexibility is WFLs  POSTURE: rounded shoulders and weight shift left  PALPATION: Palpable tension Rt>Lt lumbar parapsinals and QL. Reduced PA mobility in the lumbar spine without pain.   LUMBAR ROM:  Full lumbar A/ROM with pain with Rt sidebending and flexion.     LOWER EXTREMITY MMT:    Rt hip 4+/5, Rt lumbar pain with hip flexion, Lt hip 5/5  GAIT: Distance walked: 50  Assistive device utilized: None Level of assistance: Complete Independence  TREATMENT DATE:  05/31/24 Findings from evaluation discussed, pt educated on plan of care, HEP initiated.                                                                                                                                  PATIENT EDUCATION:  Education details: Access Code: [email or web address] Person educated: Patient Education method: Explanation, Demonstration, and Handouts Education comprehension: verbalized understanding and returned demonstration  HOME EXERCISE PROGRAM: Access Code: [email or web address] URL: [email or web address] Date: 05/31/2024 Prepared by: Burnard  Exercises - Child's Pose Stretch  - 2 x daily - 7 x weekly - 1 sets - 3 reps - 20 hold - Child's Pose with Sidebending  - 2 x daily - 7 x weekly - 1 sets - 3 reps - 20 hold - Seated Hamstring Stretch  - 3 x daily - 7 x weekly - 1 sets - 3 reps - 20 hold - Seated Piriformis Stretch with Trunk Bend  - 3 x daily - 7 x weekly - 1 sets - 3 reps - 20 hold - Standing 'L' Stretch at [employer]  -  3 x daily - 7 x weekly - 1 sets - 3 reps - 20 hold  ASSESSMENT:  CLINICAL IMPRESSION: Patient is a 38 y.o. female who was seen today for physical therapy evaluation and treatment for Rt sided LBP.  She has history of pelvic pain, emdometriosis and laparoscopic surgery in February  2025.  Rt sided low back pain began ~2 months ago.  She reports that she has not been as active in the past months due to surgery and recovery.  Pt reports 3/10 LBP on the Rt and is worse with prolonged positioning. Tension in Rt>Lt lumbar paraspinals and QL and asymmetric posturing with Lt>Rt weightbearing in sitting.  Patient will benefit from skilled PT to address the below impairments and improve overall function.   OBJECTIVE IMPAIRMENTS: decreased mobility, decreased ROM, hypomobility, improper body mechanics, postural dysfunction, and pain.   ACTIVITY LIMITATIONS: sitting and standing  PARTICIPATION LIMITATIONS: meal prep, cleaning, laundry, and occupation  PERSONAL FACTORS: 1-2 comorbidities: endometriosis with surgery, chronic pelvic pain are also affecting patient's functional outcome.   REHAB POTENTIAL: Good  CLINICAL DECISION MAKING: Stable/uncomplicated  EVALUATION COMPLEXITY: Low   GOALS: Goals reviewed with patient? Yes  SHORT TERM GOALS: Target date: 06/28/2024    Be independent in initial HEP Baseline: Goal status: INITIAL  2.  Report > or = to 30% reduction in LBP with prolonged sitting or standing  Baseline:  Goal status: INITIAL  3.  Verbalize and demonstrate body mechanics modifications modifications for lumbar protection. Baseline:  Goal status: INITIAL    LONG TERM GOALS: Target date: 07/26/2024   Be independent in advanced HEP Baseline:  Goal status: INITIAL  2.  Improve Modified Oswestry to < or = to 10% disability  Baseline: 10/50=20% Goal status: INITIAL  3.  Report > or = to 70% reduction in LBP with prolonged sitting and standing  Baseline:  Goal status: INITIAL  4.  Sit and stand with Rt=Lt weightbearing to improve muscular balance  Baseline:  Goal status: INITIAL  5.  Initiate gym program and verbalize understanding of safe progression Baseline:  Goal status: INITIAL    PLAN:  PT FREQUENCY: 2x/week  PT DURATION: 8  weeks  PLANNED INTERVENTIONS: 97110-Therapeutic exercises, 97530- Therapeutic activity, 97112- Neuromuscular re-education, 97535- Self Care, [id number]- Manual therapy, [phone]- Gait training, [phone]- Canalith repositioning, [id number]- Aquatic Therapy, [phone]- Electrical stimulation (unattended), [phone]- Electrical stimulation (manual), [id number]- Ultrasound, [id number]- Traction (mechanical), [id number] (1-2 muscles), [id number] (3+ muscles)- Dry Needling, Patient/Family education, Balance training, Taping, Joint mobilization, Joint manipulation, Spinal manipulation, Spinal mobilization, Vestibular training, Cryotherapy, and Moist heat.  PLAN FOR NEXT SESSION: dry needling if pt agrees to Rt lumbar and QL, review HEP, work on symmetry, begin core strength  Burnard Joy, PT 05/31/24 5:18 PM

## 2024-05-31 ENCOUNTER — Ambulatory Visit: Attending: Family

## 2024-05-31 ENCOUNTER — Other Ambulatory Visit: Payer: Self-pay

## 2024-05-31 DIAGNOSIS — M5459 Other low back pain: Secondary | ICD-10-CM | POA: Diagnosis present

## 2024-05-31 DIAGNOSIS — R252 Cramp and spasm: Secondary | ICD-10-CM | POA: Insufficient documentation

## 2024-05-31 NOTE — Patient Instructions (Signed)

## 2024-06-22 ENCOUNTER — Ambulatory Visit: Attending: Family | Admitting: Physical Therapy

## 2024-06-22 ENCOUNTER — Telehealth: Payer: Self-pay | Admitting: Physical Therapy

## 2024-06-22 DIAGNOSIS — M5459 Other low back pain: Secondary | ICD-10-CM | POA: Insufficient documentation

## 2024-06-22 DIAGNOSIS — R252 Cramp and spasm: Secondary | ICD-10-CM | POA: Insufficient documentation

## 2024-06-22 NOTE — Telephone Encounter (Signed)
 Left message on voicemail regarding no-show for today's appt.  Reminded of next appt on 9/11 at 4:15

## 2024-07-01 ENCOUNTER — Ambulatory Visit: Admitting: Physical Therapy

## 2024-07-01 DIAGNOSIS — M5459 Other low back pain: Secondary | ICD-10-CM

## 2024-07-01 DIAGNOSIS — R252 Cramp and spasm: Secondary | ICD-10-CM | POA: Diagnosis present

## 2024-07-01 NOTE — Patient Instructions (Signed)

## 2024-07-01 NOTE — Therapy (Signed)
 OUTPATIENT PHYSICAL THERAPY THORACOLUMBAR PROGRESS NOTE   Patient Name: April Holland MRN: [id number] DOB:1986/04/06, 38 y.o., female Today's Date: 07/01/2024  END OF SESSION:  PT End of Session - 07/01/24 1616     Visit Number 2    Date for PT Re-Evaluation 07/26/24    Authorization Type Aet[employer]    PT Start Time 1616    PT Stop Time 1655    PT Time Calculation (min) 39 min    Activity Tolerance Patient tolerated treatment well          Past Medical History:  Diagnosis Date   Endometriosis    Essential hypertension 09/17/2021   Family history of other endocrine, nutritional and metabolic diseases 02/27/2022   Hypertension    Prediabetes    Rupture of ovarian cyst    Past Surgical History:  Procedure Laterality Date   DILATATION & CURETTAGE/HYSTEROSCOPY WITH MYOSURE N/A 08/09/2020   Procedure: DILATATION & CURETTAGE/HYSTEROSCOPY WITH MYOSURE;  Surgeon: Rosalva Sawyer, MD;  Location: [facility];  Service: Gynecology;  Laterality: N/A;   HYSTEROSCOPY  2022   WISDOM TOOTH EXTRACTION     Patient Active Problem List   Diagnosis Date Noted   Pelvic pain 03/11/2024   Screening for HIV (human immunodeficiency virus) 10/13/2022   Medication monitoring encounter 10/10/2022   Pelvic abscess in female 10/10/2022   Patellofemoral pain syndrome 09/06/2022   Abdominal pain with fever after surgery 07/16/2022   Tubo-ovarian abscess 07/15/2022   Positive ANA (antinuclear antibody) 02/28/2022   Menstrual disorder 02/27/2022   Family history of other endocrine, nutritional and metabolic diseases 02/27/2022   Menorrhagia 02/27/2022   Female infertility 02/27/2022   Prediabetes 02/27/2022   Vitamin D deficiency 02/27/2022   Essential hypertension 09/17/2021   Goiter 01/11/2013    PCP: Cleotilde Emms , PA  REFERRING PROVIDER: Jolie Krabbe, FNP  REFERRING DIAG:  Diagnosis  M62.830 (ICD-10-CM) - Muscle spasm of back  R10.31 (ICD-10-CM) - Right lower quadrant  pain    Rationale for Evaluation and Treatment: Rehabilitation  THERAPY DIAG:  Cramp and spasm  Other low back pain  ONSET DATE: 2 year history   SUBJECTIVE:                                                                                                                                                                                           SUBJECTIVE STATEMENT: Had an appt with [place] no further laproscopy surgery needed.  Intermittent back pain right side.      EVAL; I've had an interesting 2 years.  I've been to ED with abdominal pain, had ovarian cyst rupture and have end stage  endometriosis. Had laproscopy and now pelvic pain is much better. I've more recently started experiencing Rt lumbar tension over the past several months.  Pt goes to the chiropractor 1x/month for adjustments and assisted stretching.   PERTINENT HISTORY:  HTN, endometriosis  PAIN: 07/01/24 Are you having pain? Yes: NPRS scale: 3/10 Pain location: Rt lumbar/flank Pain description: pulsating, achy Aggravating factors: long periods of sitting/standing  Relieving factors: change of position, stretching, Ibuprofen /Tylenol , TENs, heat  PRECAUTIONS: None  RED FLAGS: None   WEIGHT BEARING RESTRICTIONS: No  FALLS:  Has patient fallen in last 6 months? Yes. Number of falls tripped on dog- no balance deficits   LIVING ENVIRONMENT: Lives with: lives with their family   OCCUPATION: desk work  PLOF: Independent, Vocation/Vocational requirements: desk work, and Leisure: gym-weights, dance, cooking, gardening   PATIENT GOALS: return to the gym, reduce pain in low back with daily activity  NEXT MD VISIT: MRI has been ordered-for follow-up  OBJECTIVE:  Note: Objective measures were completed at Evaluation unless otherwise noted.  DIAGNOSTIC FINDINGS:  None for low back   PATIENT SURVEYS:  8/11/25Modified Oswestry: 20% (10/50)  COGNITION: Overall cognitive status: Within functional limits for  tasks assessed     SENSATION: WFL  MUSCLE LENGTH: Rt hamstring flexibility limited by 25% vs the Lt.  All other hip flexibility is WFLs  POSTURE: rounded shoulders and weight shift left  PALPATION: Palpable tension Rt>Lt lumbar parapsinals and QL. Reduced PA mobility in the lumbar spine without pain.   LUMBAR ROM:  Full lumbar A/ROM with pain with Rt sidebending and flexion.     LOWER EXTREMITY MMT:    Rt hip 4+/5, Rt lumbar pain with hip flexion, Lt hip 5/5  GAIT: Distance walked: 50  Assistive device utilized: None Level of assistance: Complete Independence  TREATMENT DATE:  07/01/24: Review of initial HEP and response Trigger Point Dry Needling Initial Treatment: Pt instructed on Dry Needling rational, procedures, and possible side effects. Pt instructed to expect mild to moderate muscle soreness later in the day and/or into the next day.  Pt instructed in methods to reduce muscle soreness. Pt instructed to continue prescribed HEP. Patient verbalized understanding of these instructions and education.  Patient Verbal Consent Given: Yes Education Handout Provided: Yes Muscles Treated: right QL sidelying over folded pillow; prone right lumbar multifidi marination Electrical Stimulation Performed: No Treatment Response/Outcome: improved soft tissue and joint mobility Manual therapy: pelvic distraction and flexion/rotation with overpressure grade 3 20 sec 5x Sidelying open books 10x (Added to HEP- see below) Standing in the doorway QL stretch (Added to HEP- see below)      05/31/24 Findings from evaluation discussed, pt educated on plan of care, HEP initiated.                                                                                                                                  PATIENT EDUCATION:  Education details: Access Code: [email or web address]  Person educated: Patient Education method: Explanation, Demonstration, and Handouts Education comprehension: verbalized  understanding and returned demonstration  HOME EXERCISE PROGRAM: Access Code: [email or web address] URL: [email or web address] Date: 07/01/2024 Prepared by: Glade Pesa  Exercises - Child's Pose Stretch  - 2 x daily - 7 x weekly - 1 sets - 3 reps - 20 hold - Child's Pose with Sidebending  - 2 x daily - 7 x weekly - 1 sets - 3 reps - 20 hold - Seated Hamstring Stretch  - 3 x daily - 7 x weekly - 1 sets - 3 reps - 20 hold - Seated Piriformis Stretch with Trunk Bend  - 3 x daily - 7 x weekly - 1 sets - 3 reps - 20 hold - Standing 'L' Stretch at Counter  - 3 x daily - 7 x weekly - 1 sets - 3 reps - 20 hold - Sidelying Open Book Thoracic Rotation with Knee on Foam Roll  - 1 x daily - 7 x weekly - 1 sets - 10 reps - Standing Quadratus Lumborum Stretch with Doorway (Mirrored)  - 1 x daily - 7 x weekly - 1 sets - 5 reps - 20 hold  ASSESSMENT:  CLINICAL IMPRESSION: The patient had a positive initial response to DN with much improved soft tissue mobility and decreased size and number of tender points.  Therapist monitoring response and educating patient on what to expect following DN and how to optimize benefit with specific exercise.  Anticipate pain intensity and mobility will continue to improve over the next few days. The patient was encouraged in regular performance of HEP post DN including soft tissue lengthening and strengthening exercises to enhance long term benefit.      OBJECTIVE IMPAIRMENTS: decreased mobility, decreased ROM, hypomobility, improper body mechanics, postural dysfunction, and pain.   ACTIVITY LIMITATIONS: sitting and standing  PARTICIPATION LIMITATIONS: meal prep, cleaning, laundry, and occupation  PERSONAL FACTORS: 1-2 comorbidities: endometriosis with surgery, chronic pelvic pain are also affecting patient's functional outcome.   REHAB POTENTIAL: Good  CLINICAL DECISION MAKING: Stable/uncomplicated  EVALUATION COMPLEXITY: Low   GOALS: Goals reviewed with  patient? Yes  SHORT TERM GOALS: Target date: 06/28/2024    Be independent in initial HEP Baseline: Goal status: INITIAL  2.  Report > or = to 30% reduction in LBP with prolonged sitting or standing  Baseline:  Goal status: INITIAL  3.  Verbalize and demonstrate body mechanics modifications modifications for lumbar protection. Baseline:  Goal status: INITIAL    LONG TERM GOALS: Target date: 07/26/2024   Be independent in advanced HEP Baseline:  Goal status: INITIAL  2.  Improve Modified Oswestry to < or = to 10% disability  Baseline: 10/50=20% Goal status: INITIAL  3.  Report > or = to 70% reduction in LBP with prolonged sitting and standing  Baseline:  Goal status: INITIAL  4.  Sit and stand with Rt=Lt weightbearing to improve muscular balance  Baseline:  Goal status: INITIAL  5.  Initiate gym program and verbalize understanding of safe progression Baseline:  Goal status: INITIAL    PLAN:  PT FREQUENCY: 2x/week  PT DURATION: 8 weeks  PLANNED INTERVENTIONS: 97110-Therapeutic exercises, 97530- Therapeutic activity, 97112- Neuromuscular re-education, 97535- Self Care, 02859- Manual therapy, [phone]- Gait training, [phone]- Canalith repositioning, [id number]- Aquatic Therapy, [id number]- Electrical stimulation (unattended), [phone]- Electrical stimulation (manual), [id number]- Ultrasound, [id number]- Traction (mechanical), [id number] (1-2 muscles), [id number] (3+ muscles)- Dry Needling, Patient/Family education, Balance training, Taping, Joint mobilization, Joint manipulation, Spinal manipulation, Spinal mobilization, Vestibular training, Cryotherapy, and  Moist heat.  PLAN FOR NEXT SESSION:  assess response to dry needling Rt lumbar and QL, review HEP, work on symmetry, begin core strength  Glade Pesa, PT 07/01/24 5:08 PM Phone: [phone] Fax: [phone]

## 2024-07-07 ENCOUNTER — Other Ambulatory Visit: Payer: Self-pay | Admitting: Family

## 2024-07-07 DIAGNOSIS — I10 Essential (primary) hypertension: Secondary | ICD-10-CM

## 2024-07-07 MED ORDER — AMLODIPINE BESYLATE 10 MG PO TABS: 10.0000 mg | ORAL_TABLET | Freq: Every day | ORAL | 1 refills | Status: AC

## 2024-07-08 ENCOUNTER — Ambulatory Visit: Admitting: Physical Therapy

## 2024-07-08 ENCOUNTER — Encounter: Payer: Self-pay | Admitting: Physical Therapy

## 2024-07-08 DIAGNOSIS — R252 Cramp and spasm: Secondary | ICD-10-CM

## 2024-07-08 DIAGNOSIS — M5459 Other low back pain: Secondary | ICD-10-CM

## 2024-07-08 NOTE — Therapy (Signed)
 OUTPATIENT PHYSICAL THERAPY THORACOLUMBAR PROGRESS NOTE   Patient Name: April Holland MRN: [id number] DOB:June 11, 1986, 38 y.o., female Today's Date: 07/08/2024  END OF SESSION:  PT End of Session - 07/08/24 1627     Visit Number 3    Date for Recertification  07/26/24    Authorization Type Aetna  DN ABN signed 9/11    PT Start Time 1626    PT Stop Time 1700    PT Time Calculation (min) 34 min    Activity Tolerance Patient tolerated treatment well          Past Medical History:  Diagnosis Date   Endometriosis    Essential hypertension 09/17/2021   Family history of other endocrine, nutritional and metabolic diseases 02/27/2022   Hypertension    Prediabetes    Rupture of ovarian cyst    Past Surgical History:  Procedure Laterality Date   DILATATION & CURETTAGE/HYSTEROSCOPY WITH MYOSURE N/A 08/09/2020   Procedure: DILATATION & CURETTAGE/HYSTEROSCOPY WITH MYOSURE;  Surgeon: Rosalva Sawyer, MD;  Location: [facility];  Service: Gynecology;  Laterality: N/A;   HYSTEROSCOPY  2022   WISDOM TOOTH EXTRACTION     Patient Active Problem List   Diagnosis Date Noted   Pelvic pain 03/11/2024   Screening for HIV (human immunodeficiency virus) 10/13/2022   Medication monitoring encounter 10/10/2022   Pelvic abscess in female 10/10/2022   Patellofemoral pain syndrome 09/06/2022   Abdominal pain with fever after surgery 07/16/2022   Tubo-ovarian abscess 07/15/2022   Positive ANA (antinuclear antibody) 02/28/2022   Menstrual disorder 02/27/2022   Family history of other endocrine, nutritional and metabolic diseases 02/27/2022   Menorrhagia 02/27/2022   Female infertility 02/27/2022   Prediabetes 02/27/2022   Vitamin D deficiency 02/27/2022   Essential hypertension 09/17/2021   Goiter 01/11/2013    PCP: Cleotilde, [place] , [place]  REFERRING PROVIDER: Jolie Krabbe, FNP  REFERRING DIAG:  Diagnosis  M62.830 (ICD-10-CM) - Muscle spasm of back  R10.31 (ICD-10-CM) -  Right lower quadrant pain    Rationale for Evaluation and Treatment: Rehabilitation  THERAPY DIAG:  Cramp and spasm  Other low back pain  ONSET DATE: 2 year history   SUBJECTIVE:                                                                                                                                                                                           SUBJECTIVE STATEMENT: I think the DN helped at the 2nd day mark.  I don't think I need it today.   EVAL; I've had an interesting 2 years.  I've been to ED with abdominal pain, had ovarian cyst rupture  and have end stage endometriosis. Had laproscopy and now pelvic pain is much better. I've more recently started experiencing Rt lumbar tension over the past several months.  Pt goes to the chiropractor 1x/month for adjustments and assisted stretching.   PERTINENT HISTORY:  HTN, endometriosis  PAIN: 07/08/24 Are you having pain? Yes: NPRS scale: 3/10 Pain location: Rt lumbar/flank Pain description: pulsating, achy Aggravating factors: long periods of sitting/standing  Relieving factors: change of position, stretching, Ibuprofen /Tylenol , TENs, heat  PRECAUTIONS: None  RED FLAGS: None   WEIGHT BEARING RESTRICTIONS: No  FALLS:  Has patient fallen in last 6 months? Yes. Number of falls tripped on dog- no balance deficits   LIVING ENVIRONMENT: Lives with: lives with their family   OCCUPATION: desk work  PLOF: Independent, Vocation/Vocational requirements: desk work, and Leisure: gym-weights, dance, cooking, gardening   PATIENT GOALS: return to the gym, reduce pain in low back with daily activity  NEXT MD VISIT: MRI has been ordered-for follow-up  OBJECTIVE:  Note: Objective measures were completed at Evaluation unless otherwise noted.  DIAGNOSTIC FINDINGS:  None for low back   PATIENT SURVEYS:  8/11/25Modified Oswestry: 20% (10/50)  COGNITION: Overall cognitive status: Within functional limits for tasks  assessed     SENSATION: WFL  MUSCLE LENGTH: Rt hamstring flexibility limited by 25% vs the Lt.  All other hip flexibility is WFLs  POSTURE: rounded shoulders and weight shift left  PALPATION: Palpable tension Rt>Lt lumbar parapsinals and QL. Reduced PA mobility in the lumbar spine without pain.   LUMBAR ROM:  Full lumbar A/ROM with pain with Rt sidebending and flexion.     LOWER EXTREMITY MMT:    Rt hip 4+/5, Rt lumbar pain with hip flexion, Lt hip 5/5  GAIT: Distance walked: 50  Assistive device utilized: None Level of assistance: Complete Independence  TREATMENT DATE:  07/08/24: Supine transverse abdominal draw in 10x (Added to HEP- see below) Supine draw in with hand to knee isometric push 10x (Added to HEP- see below) Supine bent knee sequential lift/lower 10x (Added to HEP- see below) Quadruped UE lift, LE lift, alternating UE/LE 5x each (Added to HEP- see below) Standing double green band hip abduction 5x right/left; hip extension 5x in hip hinge position (Added to HEP- see below)   TREATMENT DATE:  07/01/24: Review of initial HEP and response Trigger Point Dry Needling Initial Treatment: Pt instructed on Dry Needling rational, procedures, and possible side effects. Pt instructed to expect mild to moderate muscle soreness later in the day and/or into the next day.  Pt instructed in methods to reduce muscle soreness. Pt instructed to continue prescribed HEP. Patient verbalized understanding of these instructions and education.  Patient Verbal Consent Given: Yes Education Handout Provided: Yes Muscles Treated: right QL sidelying over folded pillow; prone right lumbar multifidi marination Electrical Stimulation Performed: No Treatment Response/Outcome: improved soft tissue and joint mobility Manual therapy: pelvic distraction and flexion/rotation with overpressure grade 3 20 sec 5x Sidelying open books 10x (Added to HEP- see below) Standing in the doorway QL  stretch (Added to HEP- see below)      05/31/24 Findings from evaluation discussed, pt educated on plan of care, HEP initiated.  PATIENT EDUCATION:  Education details: Access Code: [email or web address] Person educated: Patient Education method: Explanation, Demonstration, and Handouts Education comprehension: verbalized understanding and returned demonstration  HOME EXERCISE PROGRAM: Access Code: [email or web address] URL: [email or web address] Date: 07/08/2024 Prepared by: Glade Pesa  Exercises - Child's Pose Stretch  - 2 x daily - 7 x weekly - 1 sets - 3 reps - 20 hold - Child's Pose with Sidebending  - 2 x daily - 7 x weekly - 1 sets - 3 reps - 20 hold - Seated Hamstring Stretch  - 3 x daily - 7 x weekly - 1 sets - 3 reps - 20 hold - Seated Piriformis Stretch with Trunk Bend  - 3 x daily - 7 x weekly - 1 sets - 3 reps - 20 hold - Standing 'L' Stretch at Counter  - 3 x daily - 7 x weekly - 1 sets - 3 reps - 20 hold - Sidelying Open Book Thoracic Rotation with Knee on Foam Roll  - 1 x daily - 7 x weekly - 1 sets - 10 reps - Standing Quadratus Lumborum Stretch with Doorway (Mirrored)  - 1 x daily - 7 x weekly - 1 sets - 5 reps - 20 hold - Supine Transversus Abdominis Bracing - Hands on Stomach  - 1 x daily - 7 x weekly - 1 sets - 10 reps - Hooklying Isometric Hip Flexion  - 1 x daily - 7 x weekly - 1 sets - 10 reps - Hooklying Sequential Leg March and Lower  - 1 x daily - 7 x weekly - 1 sets - 10 reps - Bird Dog  - 1 x daily - 7 x weekly - 1 sets - 10 reps - Standing Clam with Resistance Loop  - 1 x daily - 7 x weekly - 1 sets - 10 reps - Farmer's Carry with Kettlebells  - 1 x daily - 7 x weekly - 1 sets - 10 reps   ASSESSMENT:  CLINICAL IMPRESSION: Verbal cues to activate the core during exercises to support better posture, balance and movement efficiency  which are key elements of coordination and strength. Noted right glute medius weakness with standing ex's with quick muscle fatigue.  HEP updated to include core strengthening.      OBJECTIVE IMPAIRMENTS: decreased mobility, decreased ROM, hypomobility, improper body mechanics, postural dysfunction, and pain.   ACTIVITY LIMITATIONS: sitting and standing  PARTICIPATION LIMITATIONS: meal prep, cleaning, laundry, and occupation  PERSONAL FACTORS: 1-2 comorbidities: endometriosis with surgery, chronic pelvic pain are also affecting patient's functional outcome.   REHAB POTENTIAL: Good  CLINICAL DECISION MAKING: Stable/uncomplicated  EVALUATION COMPLEXITY: Low   GOALS: Goals reviewed with patient? Yes  SHORT TERM GOALS: Target date: 06/28/2024    Be independent in initial HEP Baseline: Goal status: met 9/18  2.  Report > or = to 30% reduction in LBP with prolonged sitting or standing  Baseline:  Goal status: ongoing  3.  Verbalize and demonstrate body mechanics modifications modifications for lumbar protection. Baseline:  Goal status: met 9/18    LONG TERM GOALS: Target date: 07/26/2024   Be independent in advanced HEP Baseline:  Goal status: INITIAL  2.  Improve Modified Oswestry to < or = to 10% disability  Baseline: 10/50=20% Goal status: INITIAL  3.  Report > or = to 70% reduction in LBP with prolonged sitting and standing  Baseline:  Goal status: INITIAL  4.  Sit and stand with Rt=Lt weightbearing to improve muscular balance  Baseline:  Goal  status: INITIAL  5.  Initiate gym program and verbalize understanding of safe progression Baseline:  Goal status: INITIAL    PLAN:  PT FREQUENCY: 2x/week  PT DURATION: 8 weeks  PLANNED INTERVENTIONS: 97110-Therapeutic exercises, 97530- Therapeutic activity, 97112- Neuromuscular re-education, 97535- Self Care, 02859- Manual therapy, [phone]- Gait training, [phone]- Canalith repositioning, [id number]- Aquatic Therapy,  [phone]- Electrical stimulation (unattended), [phone]- Electrical stimulation (manual), [id number]- Ultrasound, [id number]- Traction (mechanical), [id number] (1-2 muscles), [id number] (3+ muscles)- Dry Needling, Patient/Family education, Balance training, Taping, Joint mobilization, Joint manipulation, Spinal manipulation, Spinal mobilization, Vestibular training, Cryotherapy, and Moist heat.  PLAN FOR NEXT SESSION: check % improvement for STG; progression of core strength; dry needling Rt lumbar and QL as needed, review HEP, work on [employer], PT 07/08/24 9:16 PM Phone: [phone] Fax: [phone]

## 2024-07-15 ENCOUNTER — Ambulatory Visit: Admitting: Physical Therapy

## 2024-07-22 ENCOUNTER — Encounter: Admitting: Physical Therapy

## 2024-07-27 ENCOUNTER — Ambulatory Visit: Attending: Family | Admitting: Physical Therapy

## 2024-07-27 ENCOUNTER — Encounter: Payer: Self-pay | Admitting: Physical Therapy

## 2024-07-27 DIAGNOSIS — M5459 Other low back pain: Secondary | ICD-10-CM | POA: Insufficient documentation

## 2024-07-27 DIAGNOSIS — R252 Cramp and spasm: Secondary | ICD-10-CM | POA: Insufficient documentation

## 2024-07-27 NOTE — Therapy (Addendum)
 OUTPATIENT PHYSICAL THERAPY THORACOLUMBAR PROGRESS NOTE/ RE-CERTIFICATION   Patient Name: April Holland MRN: [id number] DOB:17-Dec-1985, 38 y.o., female Today's Date: 07/27/2024  END OF SESSION:  PT End of Session - 07/27/24 1723     Visit Number 4    Date for Recertification  09/24/24    Authorization Type [employer]  DN ABN signed 10/7    PT Start Time 1619    PT Stop Time 1700    PT Time Calculation (min) 41 min    Activity Tolerance Patient tolerated treatment well    Behavior During Therapy [facility] for tasks assessed/performed           Past Medical History:  Diagnosis Date   Endometriosis    Essential hypertension 09/17/2021   Family history of other endocrine, nutritional and metabolic diseases 02/27/2022   Hypertension    Prediabetes    Rupture of ovarian cyst    Past Surgical History:  Procedure Laterality Date   DILATATION & CURETTAGE/HYSTEROSCOPY WITH MYOSURE N/A 08/09/2020   Procedure: DILATATION & CURETTAGE/HYSTEROSCOPY WITH MYOSURE;  Surgeon: Rosalva Sawyer, MD;  Location: [facility];  Service: Gynecology;  Laterality: N/A;   HYSTEROSCOPY  2022   WISDOM TOOTH EXTRACTION     Patient Active Problem List   Diagnosis Date Noted   Pelvic pain 03/11/2024   Screening for HIV (human immunodeficiency virus) 10/13/2022   Medication monitoring encounter 10/10/2022   Pelvic abscess in female 10/10/2022   Patellofemoral pain syndrome 09/06/2022   Abdominal pain with fever after surgery 07/16/2022   Tubo-ovarian abscess 07/15/2022   Positive ANA (antinuclear antibody) 02/28/2022   Menstrual disorder 02/27/2022   Family history of other endocrine, nutritional and metabolic diseases 02/27/2022   Menorrhagia 02/27/2022   Female infertility 02/27/2022   Prediabetes 02/27/2022   Vitamin D deficiency 02/27/2022   Essential hypertension 09/17/2021   Goiter 01/11/2013    PCP: Cleotilde, [place] , [place]  REFERRING PROVIDER: Jolie Krabbe, FNP  REFERRING DIAG:   Diagnosis  M62.830 (ICD-10-CM) - Muscle spasm of back  R10.31 (ICD-10-CM) - Right lower quadrant pain    Rationale for Evaluation and Treatment: Rehabilitation  THERAPY DIAG:  Cramp and spasm  Other low back pain  ONSET DATE: 2 year history   SUBJECTIVE:                                                                                                                                                                                           SUBJECTIVE STATEMENT: Patient reports she is having a little more discomfort today. She has been doing her HEP exercises but not working out. Pain today has range 6/10.  EVAL; I've had an interesting 2 years.  I've been to ED with abdominal pain, had ovarian cyst rupture and have end stage endometriosis. Had laproscopy and now pelvic pain is much better. I've more recently started experiencing Rt lumbar tension over the past several months.  Pt goes to the chiropractor 1x/month for adjustments and assisted stretching.   PERTINENT HISTORY:  HTN, endometriosis  PAIN: 07/08/24 Are you having pain? Yes: NPRS scale: 3/10 Pain location: Rt lumbar/flank Pain description: pulsating, achy Aggravating factors: long periods of sitting/standing  Relieving factors: change of position, stretching, Ibuprofen /Tylenol , TENs, heat  PRECAUTIONS: None  RED FLAGS: None   WEIGHT BEARING RESTRICTIONS: No  FALLS:  Has patient fallen in last 6 months? Yes. Number of falls tripped on dog- no balance deficits   LIVING ENVIRONMENT: Lives with: lives with their family   OCCUPATION: desk work  PLOF: Independent, Vocation/Vocational requirements: desk work, and Leisure: gym-weights, dance, cooking, gardening   PATIENT GOALS: return to the gym, reduce pain in low back with daily activity  NEXT MD VISIT: MRI has been ordered-for follow-up  OBJECTIVE:  Note: Objective measures were completed at Evaluation unless otherwise noted.  DIAGNOSTIC FINDINGS:  None  for low back   PATIENT SURVEYS:  8/11/25Modified Oswestry: 20% (10/50)  COGNITION: Overall cognitive status: Within functional limits for tasks assessed     SENSATION: WFL  MUSCLE LENGTH: Rt hamstring flexibility limited by 25% vs the Lt.  All other hip flexibility is WFLs  POSTURE: rounded shoulders and weight shift left  PALPATION: Palpable tension Rt>Lt lumbar parapsinals and QL. Reduced PA mobility in the lumbar spine without pain.   LUMBAR ROM:  Full lumbar A/ROM with pain with Rt sidebending and flexion.     LOWER EXTREMITY MMT:    Rt hip 4+/5, Rt lumbar pain with hip flexion, Lt hip 5/5  GAIT: Distance walked: 50  Assistive device utilized: None Level of assistance: Complete Independence  TREATMENT DATE:  07/27/24: NuStep Level 3 (legs only) 5 mins- PT present to discuss status Quadruped UE lift, LE lift, alternating UE/LE 5x each  Supine transverse abdominal draw in 10x  Supine bent knee sequential lift/lower 10x  Hooklying alt hand and knee press with purple ball x 10 bilateral   Trigger Point Dry Needling  Subsequent Treatment: Instructions provided previously at initial dry needling treatment.   Patient Verbal Consent Given: Yes Education Handout Provided: Previously Provided Muscles Treated: Right QL side lying; right lumbar multifidi  Electrical Stimulation Performed: No Treatment Response/Outcome:  Patient will verbalize and demonstrate correct body mechanics for transfers and lifting to optimize mobility of third trimester of current pregnancy  Open books x 10 bilateral  Doorway QL stretch 2 x 15 sec bilateral      07/08/24: Supine transverse abdominal draw in 10x (Added to HEP- see below) Supine draw in with hand to knee isometric push 10x (Added to HEP- see below) Supine bent knee sequential lift/lower 10x (Added to HEP- see below) Quadruped UE lift, LE lift, alternating UE/LE 5x each (Added to HEP- see below) Standing double green band hip  abduction 5x right/left; hip extension 5x in hip hinge position (Added to HEP- see below)   07/01/24: Review of initial HEP and response Trigger Point Dry Needling Initial Treatment: Pt instructed on Dry Needling rational, procedures, and possible side effects. Pt instructed to expect mild to moderate muscle soreness later in the day and/or into the next day.  Pt instructed in methods to reduce muscle soreness. Pt instructed to  continue prescribed HEP. Patient verbalized understanding of these instructions and education.  Patient Verbal Consent Given: Yes Education Handout Provided: Yes Muscles Treated: right QL sidelying over folded pillow; prone right lumbar multifidi marination Electrical Stimulation Performed: No Treatment Response/Outcome: improved soft tissue and joint mobility Manual therapy: pelvic distraction and flexion/rotation with overpressure grade 3 20 sec 5x Sidelying open books 10x (Added to HEP- see below) Standing in the doorway QL stretch (Added to HEP- see below)     PATIENT EDUCATION:  Education details: Access Code: [email or web address] Person educated: Patient Education method: Explanation, Demonstration, and Handouts Education comprehension: verbalized understanding and returned demonstration  HOME EXERCISE PROGRAM: Access Code: [email or web address] URL: [email or web address] Date: 07/08/2024 Prepared by: Glade Pesa  Exercises - Child's Pose Stretch  - 2 x daily - 7 x weekly - 1 sets - 3 reps - 20 hold - Child's Pose with Sidebending  - 2 x daily - 7 x weekly - 1 sets - 3 reps - 20 hold - Seated Hamstring Stretch  - 3 x daily - 7 x weekly - 1 sets - 3 reps - 20 hold - Seated Piriformis Stretch with Trunk Bend  - 3 x daily - 7 x weekly - 1 sets - 3 reps - 20 hold - Standing 'L' Stretch at Counter  - 3 x daily - 7 x weekly - 1 sets - 3 reps - 20 hold - Sidelying Open Book Thoracic Rotation with Knee on Foam Roll  - 1 x daily - 7 x weekly - 1 sets - 10 reps -  Standing Quadratus Lumborum Stretch with Doorway (Mirrored)  - 1 x daily - 7 x weekly - 1 sets - 5 reps - 20 hold - Supine Transversus Abdominis Bracing - Hands on Stomach  - 1 x daily - 7 x weekly - 1 sets - 10 reps - Hooklying Isometric Hip Flexion  - 1 x daily - 7 x weekly - 1 sets - 10 reps - Hooklying Sequential Leg March and Lower  - 1 x daily - 7 x weekly - 1 sets - 10 reps - Bird Dog  - 1 x daily - 7 x weekly - 1 sets - 10 reps - Standing Clam with Resistance Loop  - 1 x daily - 7 x weekly - 1 sets - 10 reps - Farmer's Carry with Kettlebells  - 1 x daily - 7 x weekly - 1 sets - 10 reps   ASSESSMENT:  CLINICAL IMPRESSION: Re-certification completed today. Saliha verbalized feeling 50% better since starting skilled therapy. Minimal progress has been made towards goals due to scheduling conflicts, but patient is responding well to lumbar flexibility exercises , core exercises, and dry needling. She verbalized an average of 6/10 back pain today. She has been trying to stay compliant with updated HEP exercises. Reviewed HEP and patient required verbal cues for correct breathing technique while performing exercises. Dry needling was successful at targeting muscle trigger points and improving flexibility. Educated patient on the importance of trying to move every hour during her work day for improved circulation and to prevent stiffness. Patient would benefit from continued therapy to meet remaining goals and improve function.      OBJECTIVE IMPAIRMENTS: decreased mobility, decreased ROM, hypomobility, improper body mechanics, postural dysfunction, and pain.   ACTIVITY LIMITATIONS: sitting and standing  PARTICIPATION LIMITATIONS: meal prep, cleaning, laundry, and occupation  PERSONAL FACTORS: 1-2 comorbidities: endometriosis with surgery, chronic pelvic pain are also affecting patient's functional outcome.   REHAB POTENTIAL: Good  CLINICAL DECISION MAKING:  Stable/uncomplicated  EVALUATION COMPLEXITY: Low   GOALS: Goals reviewed with patient? Yes  SHORT TERM GOALS: Target date: 06/28/2024    Be independent in initial HEP Baseline: Goal status: met 9/18  2.  Report > or = to 30% reduction in LBP with prolonged sitting or standing  Baseline:  Goal status: MET 07/27/2024  3.  Verbalize and demonstrate body mechanics modifications modifications for lumbar protection. Baseline:  Goal status: met 9/18    LONG TERM GOALS: Target date: 09/24/2024   Be independent in advanced HEP Baseline:  Goal status: IN PROGRESS 07/27/2024  2.  Improve Modified Oswestry to < or = to 10% disability  Baseline: 10/50=20% Goal status: IN PROGRESS 07/27/2024  3.  Report > or = to 70% reduction in LBP with prolonged sitting and standing  Baseline:  Goal status: IN PROGRESS (50%) 07/27/2024  4.  Sit and stand with Rt=Lt weightbearing to improve muscular balance  Baseline:  Goal status: MET 07/27/2024  5.  Initiate gym program and verbalize understanding of safe progression Baseline:  Goal status: IN PROGRESS 07/27/2024    PLAN:  PT FREQUENCY: 2x/week  PT DURATION: 8 weeks  PLANNED INTERVENTIONS: 97110-Therapeutic exercises, 97530- Therapeutic activity, 97112- Neuromuscular re-education, 97535- Self Care, 02859- Manual therapy, [phone]- Gait training, [phone]- Canalith repositioning, [id number]- Aquatic Therapy, [phone]- Electrical stimulation (unattended), [phone]- Electrical stimulation (manual), [id number]- Ultrasound, [id number]- Traction (mechanical), [id number] (1-2 muscles), [id number] (3+ muscles)- Dry Needling, Patient/Family education, Balance training, Taping, Joint mobilization, Joint manipulation, Spinal manipulation, Spinal mobilization, Vestibular training, Cryotherapy, and Moist heat.  PLAN FOR NEXT SESSION: check % improvement for STG; progression of core strength; dry needling Rt lumbar and QL as needed, review HEP, work on symmetry  Kristeen Sar, PT, DPT 09/02/24  5:04 PM

## 2024-08-18 ENCOUNTER — Encounter

## 2024-08-26 ENCOUNTER — Ambulatory Visit: Attending: Family

## 2024-09-02 ENCOUNTER — Ambulatory Visit: Attending: Family | Admitting: Physical Therapy

## 2024-09-02 ENCOUNTER — Encounter: Payer: Self-pay | Admitting: Physical Therapy

## 2024-09-02 DIAGNOSIS — M5459 Other low back pain: Secondary | ICD-10-CM | POA: Diagnosis present

## 2024-09-02 DIAGNOSIS — R252 Cramp and spasm: Secondary | ICD-10-CM | POA: Diagnosis present

## 2024-09-02 NOTE — Therapy (Signed)
 OUTPATIENT PHYSICAL THERAPY THORACOLUMBAR PROGRESS NOTE   Patient Name: April Holland MRN: [id number] DOB:08/06/1986, 38 y.o., female Today's Date: 09/02/2024  END OF SESSION:  PT End of Session - 09/02/24 1705     Visit Number 5    Date for Recertification  09/24/24    Authorization Type [employer]et[employer]  DN ABN signed 11/13    PT Start Time 1626    PT Stop Time 1654    PT Time Calculation (min) 28 min    Activity Tolerance Patient tolerated treatment well    Behavior During Therapy [facility] for tasks assessed/performed            Past Medical History:  Diagnosis Date   Endometriosis    Essential hypertension 09/17/2021   Family history of other endocrine, nutritional and metabolic diseases 02/27/2022   Hypertension    Prediabetes    Rupture of ovarian cyst    Past Surgical History:  Procedure Laterality Date   DILATATION & CURETTAGE/HYSTEROSCOPY WITH MYOSURE N/A 08/09/2020   Procedure: DILATATION & CURETTAGE/HYSTEROSCOPY WITH MYOSURE;  Surgeon: Rosalva Sawyer, MD;  Location: [facility];  Service: Gynecology;  Laterality: N/A;   HYSTEROSCOPY  2022   WISDOM TOOTH EXTRACTION     Patient Active Problem List   Diagnosis Date Noted   Pelvic pain 03/11/2024   Screening for HIV (human immunodeficiency virus) 10/13/2022   Medication monitoring encounter 10/10/2022   Pelvic abscess in female 10/10/2022   Patellofemoral pain syndrome 09/06/2022   Abdominal pain with fever after surgery 07/16/2022   Tubo-ovarian abscess 07/15/2022   Positive ANA (antinuclear antibody) 02/28/2022   Menstrual disorder 02/27/2022   Family history of other endocrine, nutritional and metabolic diseases 02/27/2022   Menorrhagia 02/27/2022   Female infertility 02/27/2022   Prediabetes 02/27/2022   Vitamin D deficiency 02/27/2022   Essential hypertension 09/17/2021   Goiter 01/11/2013    PCP: Cleotilde, [place] , [place]  REFERRING PROVIDER: Jolie Krabbe, FNP  REFERRING DIAG:  Diagnosis   M62.830 (ICD-10-CM) - Muscle spasm of back  R10.31 (ICD-10-CM) - Right lower quadrant pain    Rationale for Evaluation and Treatment: Rehabilitation  THERAPY DIAG:  Cramp and spasm  Other low back pain  ONSET DATE: 2 year history   SUBJECTIVE:                                                                                                                                                                                           SUBJECTIVE STATEMENT: Patient reports she is okay today. She has had more discomfort in her low back recently. She responded well to dry needling last session.   EVAL;  I've had an interesting 2 years.  I've been to ED with abdominal pain, had ovarian cyst rupture and have end stage endometriosis. Had laproscopy and now pelvic pain is much better. I've more recently started experiencing Rt lumbar tension over the past several months.  Pt goes to the chiropractor 1x/month for adjustments and assisted stretching.   PERTINENT HISTORY:  HTN, endometriosis  PAIN: 07/08/24 Are you having pain? Yes: NPRS scale: 3/10 Pain location: Rt lumbar/flank Pain description: pulsating, achy Aggravating factors: long periods of sitting/standing  Relieving factors: change of position, stretching, Ibuprofen /Tylenol , TENs, heat  PRECAUTIONS: None  RED FLAGS: None   WEIGHT BEARING RESTRICTIONS: No  FALLS:  Has patient fallen in last 6 months? Yes. Number of falls tripped on dog- no balance deficits   LIVING ENVIRONMENT: Lives with: lives with their family   OCCUPATION: desk work  PLOF: Independent, Vocation/Vocational requirements: desk work, and Leisure: gym-weights, dance, cooking, gardening   PATIENT GOALS: return to the gym, reduce pain in low back with daily activity  NEXT MD VISIT: MRI has been ordered-for follow-up  OBJECTIVE:  Note: Objective measures were completed at Evaluation unless otherwise noted.  DIAGNOSTIC FINDINGS:  None for low back    PATIENT SURVEYS:  8/11/25Modified Oswestry: 20% (10/50)  COGNITION: Overall cognitive status: Within functional limits for tasks assessed     SENSATION: WFL  MUSCLE LENGTH: Rt hamstring flexibility limited by 25% vs the Lt.  All other hip flexibility is WFLs  POSTURE: rounded shoulders and weight shift left  PALPATION: Palpable tension Rt>Lt lumbar parapsinals and QL. Reduced PA mobility in the lumbar spine without pain.   LUMBAR ROM:  Full lumbar A/ROM with pain with Rt sidebending and flexion.     LOWER EXTREMITY MMT:    Rt hip 4+/5, Rt lumbar pain with hip flexion, Lt hip 5/5  GAIT: Distance walked: 50  Assistive device utilized: None Level of assistance: Complete Independence  TREATMENT DATE:  09/02/2024 Trigger Point Dry Needling  Subsequent Treatment: Instructions provided previously at initial dry needling treatment.   Patient Verbal Consent Given: Yes Education Handout Provided: Previously Provided Muscles Treated: Rt QL & Lt lumbar multifidi Lt  Electrical Stimulation Performed: No Treatment Response/Outcome: Utilized skilled palpation to identify trigger points.  During dry needling able to palpate muscle twitch and muscle elongation  Soft tissue mobilization performed to further promote tissue elongation and decreased pain.      Single knee to chest 2 x 30 sec Rt  Supine glute stretch 2 x 30 sec Rt  Hip internal/ external rotation x 8 5 sec hold Supine ITB stretch 2 x 30 sec bilateral  Child's Pose 2 x 30 sec     07/27/24: NuStep Level 3 (legs only) 5 mins- PT present to discuss status Quadruped UE lift, LE lift, alternating UE/LE 5x each  Supine transverse abdominal draw in 10x  Supine bent knee sequential lift/lower 10x  Hooklying alt hand and knee press with purple ball x 10 bilateral   Trigger Point Dry Needling  Subsequent Treatment: Instructions provided previously at initial dry needling treatment.   Patient Verbal Consent Given:  Yes Education Handout Provided: Previously Provided Muscles Treated: Right QL side lying; right lumbar multifidi  Electrical Stimulation Performed: No Treatment Response/Outcome:  Patient will verbalize and demonstrate correct body mechanics for transfers and lifting to optimize mobility of third trimester of current pregnancy  Open books x 10 bilateral  Doorway QL stretch 2 x 15 sec bilateral      07/08/24:  Supine transverse abdominal draw in 10x (Added to HEP- see below) Supine draw in with hand to knee isometric push 10x (Added to HEP- see below) Supine bent knee sequential lift/lower 10x (Added to HEP- see below) Quadruped UE lift, LE lift, alternating UE/LE 5x each (Added to HEP- see below) Standing double green band hip abduction 5x right/left; hip extension 5x in hip hinge position (Added to HEP- see below)   07/01/24: Review of initial HEP and response Trigger Point Dry Needling Initial Treatment: Pt instructed on Dry Needling rational, procedures, and possible side effects. Pt instructed to expect mild to moderate muscle soreness later in the day and/or into the next day.  Pt instructed in methods to reduce muscle soreness. Pt instructed to continue prescribed HEP. Patient verbalized understanding of these instructions and education.  Patient Verbal Consent Given: Yes Education Handout Provided: Yes Muscles Treated: right QL sidelying over folded pillow; prone right lumbar multifidi marination Electrical Stimulation Performed: No Treatment Response/Outcome: improved soft tissue and joint mobility Manual therapy: pelvic distraction and flexion/rotation with overpressure grade 3 20 sec 5x Sidelying open books 10x (Added to HEP- see below) Standing in the doorway QL stretch (Added to HEP- see below)     PATIENT EDUCATION:  Education details: Access Code: [email or web address] Person educated: Patient Education method: Explanation, Demonstration, and Handouts Education comprehension:  verbalized understanding and returned demonstration  HOME EXERCISE PROGRAM: Access Code: [email or web address] URL: [email or web address] Date: 09/02/2024 Prepared by: Kristeen Sar  Exercises - Child's Pose Stretch  - 2 x daily - 7 x weekly - 1 sets - 3 reps - 20 hold - Child's Pose with Sidebending  - 2 x daily - 7 x weekly - 1 sets - 3 reps - 20 hold - Seated Hamstring Stretch  - 3 x daily - 7 x weekly - 1 sets - 3 reps - 20 hold - Seated Piriformis Stretch with Trunk Bend  - 3 x daily - 7 x weekly - 1 sets - 3 reps - 20 hold - Standing 'L' Stretch at Counter  - 3 x daily - 7 x weekly - 1 sets - 3 reps - 20 hold - Sidelying Open Book Thoracic Rotation with Knee on Foam Roll  - 1 x daily - 7 x weekly - 1 sets - 10 reps - Standing Quadratus Lumborum Stretch with Doorway (Mirrored)  - 1 x daily - 7 x weekly - 1 sets - 5 reps - 20 hold - Supine Transversus Abdominis Bracing - Hands on Stomach  - 1 x daily - 7 x weekly - 1 sets - 10 reps - Hooklying Isometric Hip Flexion  - 1 x daily - 7 x weekly - 1 sets - 10 reps - Hooklying Sequential Leg March and Lower  - 1 x daily - 7 x weekly - 1 sets - 10 reps - Bird Dog  - 1 x daily - 7 x weekly - 1 sets - 10 reps - Standing Clam with Resistance Loop  - 1 x daily - 7 x weekly - 1 sets - 10 reps - Farmer's Carry with Kettlebells  - 1 x daily - 7 x weekly - 1 sets - 10 reps - Supine Single Knee to Chest Stretch  - 1 x daily - 7 x weekly - 2 sets - 30s hold - Supine Gluteus Stretch  - 1 x daily - 7 x weekly - 2 sets - 30s hold - Supine Hip Internal and External Rotation  - 1 x daily - 7  x weekly - 8 reps - 4-5s hold - Supine ITB Stretch with Strap  - 1 x daily - 7 x weekly - 2 sets - 30s hold   ASSESSMENT:  CLINICAL IMPRESSION: Patient presents with 5/10 pain in her low back today. She feels her back pain has been inconsistent in that some days she does not have pain. She responded well to dry needling last session and wanted to try that against.  Dry needling was successful was eliciting muscle twitch and elongation. Patient verbalized relief after treatment session. Educated patient on the importance of staying consistent with core exercises. Updated HEP to include exercise progressions. Patient will benefit from skilled PT to address the below impairments and improve overall function.      OBJECTIVE IMPAIRMENTS: decreased mobility, decreased ROM, hypomobility, improper body mechanics, postural dysfunction, and pain.   ACTIVITY LIMITATIONS: sitting and standing  PARTICIPATION LIMITATIONS: meal prep, cleaning, laundry, and occupation  PERSONAL FACTORS: 1-2 comorbidities: endometriosis with surgery, chronic pelvic pain are also affecting patient's functional outcome.   REHAB POTENTIAL: Good  CLINICAL DECISION MAKING: Stable/uncomplicated  EVALUATION COMPLEXITY: Low   GOALS: Goals reviewed with patient? Yes  SHORT TERM GOALS: Target date: 06/28/2024    Be independent in initial HEP Baseline: Goal status: met 9/18  2.  Report > or = to 30% reduction in LBP with prolonged sitting or standing  Baseline:  Goal status: MET 07/27/2024  3.  Verbalize and demonstrate body mechanics modifications modifications for lumbar protection. Baseline:  Goal status: met 9/18    LONG TERM GOALS: Target date: 09/24/2024   Be independent in advanced HEP Baseline:  Goal status: IN PROGRESS 07/27/2024  2.  Improve Modified Oswestry to < or = to 10% disability  Baseline: 10/50=20% Goal status: IN PROGRESS 07/27/2024  3.  Report > or = to 70% reduction in LBP with prolonged sitting and standing  Baseline:  Goal status: IN PROGRESS (50%) 07/27/2024  4.  Sit and stand with Rt=Lt weightbearing to improve muscular balance  Baseline:  Goal status: MET 07/27/2024  5.  Initiate gym program and verbalize understanding of safe progression Baseline:  Goal status: IN PROGRESS 07/27/2024    PLAN:  PT FREQUENCY: 2x/week  PT DURATION: 8  weeks  PLANNED INTERVENTIONS: 97110-Therapeutic exercises, 97530- Therapeutic activity, 97112- Neuromuscular re-education, 97535- Self Care, 02859- Manual therapy, [phone]- Gait training, [phone]- Canalith repositioning, [id number]- Aquatic Therapy, [phone]- Electrical stimulation (unattended), [phone]- Electrical stimulation (manual), [id number]- Ultrasound, [id number]- Traction (mechanical), [id number] (1-2 muscles), [id number] (3+ muscles)- Dry Needling, Patient/Family education, Balance training, Taping, Joint mobilization, Joint manipulation, Spinal manipulation, Spinal mobilization, Vestibular training, Cryotherapy, and Moist heat.  PLAN FOR NEXT SESSION: compliance with core exercises; see how DN was; progression of core strength; work on symmetry  Kristeen Sar, PT, DPT 09/02/24 5:06 PM

## 2024-09-09 ENCOUNTER — Ambulatory Visit: Admitting: Physical Therapy

## 2024-09-09 ENCOUNTER — Encounter: Payer: Self-pay | Admitting: Physical Therapy

## 2024-09-09 DIAGNOSIS — R252 Cramp and spasm: Secondary | ICD-10-CM | POA: Diagnosis not present

## 2024-09-09 DIAGNOSIS — M5459 Other low back pain: Secondary | ICD-10-CM

## 2024-09-09 NOTE — Therapy (Signed)
 OUTPATIENT PHYSICAL THERAPY THORACOLUMBAR PROGRESS NOTE   Patient Name: April Holland MRN: [id number] DOB:1986-07-18, 38 y.o., female Today's Date: 09/09/2024  END OF SESSION:  PT End of Session - 09/09/24 1704     Visit Number 6    Date for Recertification  09/24/24    Authorization Type [employer]  DN ABN signed 11/13    PT Start Time 1625   arrival time   PT Stop Time 1702    PT Time Calculation (min) 37 min    Activity Tolerance Patient tolerated treatment well    Behavior During Therapy [facility] for tasks assessed/performed             Past Medical History:  Diagnosis Date   Endometriosis    Essential hypertension 09/17/2021   Family history of other endocrine, nutritional and metabolic diseases 02/27/2022   Hypertension    Prediabetes    Rupture of ovarian cyst    Past Surgical History:  Procedure Laterality Date   DILATATION & CURETTAGE/HYSTEROSCOPY WITH MYOSURE N/A 08/09/2020   Procedure: DILATATION & CURETTAGE/HYSTEROSCOPY WITH MYOSURE;  Surgeon: Rosalva Sawyer, MD;  Location: [facility];  Service: Gynecology;  Laterality: N/A;   HYSTEROSCOPY  2022   WISDOM TOOTH EXTRACTION     Patient Active Problem List   Diagnosis Date Noted   Pelvic pain 03/11/2024   Screening for HIV (human immunodeficiency virus) 10/13/2022   Medication monitoring encounter 10/10/2022   Pelvic abscess in female 10/10/2022   Patellofemoral pain syndrome 09/06/2022   Abdominal pain with fever after surgery 07/16/2022   Tubo-ovarian abscess 07/15/2022   Positive ANA (antinuclear antibody) 02/28/2022   Menstrual disorder 02/27/2022   Family history of other endocrine, nutritional and metabolic diseases 02/27/2022   Menorrhagia 02/27/2022   Female infertility 02/27/2022   Prediabetes 02/27/2022   Vitamin D deficiency 02/27/2022   Essential hypertension 09/17/2021   Goiter 01/11/2013    PCP: [place], [place] , [place]  REFERRING PROVIDER: Jolie Krabbe, FNP  REFERRING  DIAG:  Diagnosis  M62.830 (ICD-10-CM) - Muscle spasm of back  R10.31 (ICD-10-CM) - Right lower quadrant pain    Rationale for Evaluation and Treatment: Rehabilitation  THERAPY DIAG:  Cramp and spasm  Other low back pain  ONSET DATE: 2 year history   SUBJECTIVE:                                                                                                                                                                                           SUBJECTIVE STATEMENT: Patient reports she is not having any pain today. Since last session pain has been better.    EVAL; I've had an  interesting 2 years.  I've been to ED with abdominal pain, had ovarian cyst rupture and have end stage endometriosis. Had laproscopy and now pelvic pain is much better. I've more recently started experiencing Rt lumbar tension over the past several months.  Pt goes to the chiropractor 1x/month for adjustments and assisted stretching.   PERTINENT HISTORY:  HTN, endometriosis  PAIN: 09/09/24 Are you having pain? Yes: NPRS scale: 0/10 Pain location: Rt lumbar/flank Pain description: pulsating, achy Aggravating factors: long periods of sitting/standing  Relieving factors: change of position, stretching, Ibuprofen /Tylenol , TENs, heat  PRECAUTIONS: None  RED FLAGS: None   WEIGHT BEARING RESTRICTIONS: No  FALLS:  Has patient fallen in last 6 months? Yes. Number of falls tripped on dog- no balance deficits   LIVING ENVIRONMENT: Lives with: lives with their family   OCCUPATION: desk work  PLOF: Independent, Vocation/Vocational requirements: desk work, and Leisure: gym-weights, dance, cooking, gardening   PATIENT GOALS: return to the gym, reduce pain in low back with daily activity  NEXT MD VISIT: MRI has been ordered-for follow-up  OBJECTIVE:  Note: Objective measures were completed at Evaluation unless otherwise noted.  DIAGNOSTIC FINDINGS:  None for low back   PATIENT SURVEYS:   8/11/25Modified Oswestry: 20% (10/50)  COGNITION: Overall cognitive status: Within functional limits for tasks assessed     SENSATION: WFL  MUSCLE LENGTH: Rt hamstring flexibility limited by 25% vs the Lt.  All other hip flexibility is WFLs  POSTURE: rounded shoulders and weight shift left  PALPATION: Palpable tension Rt>Lt lumbar parapsinals and QL. Reduced PA mobility in the lumbar spine without pain.   LUMBAR ROM:  Full lumbar A/ROM with pain with Rt sidebending and flexion.     LOWER EXTREMITY MMT:    Rt hip 4+/5, Rt lumbar pain with hip flexion, Lt hip 5/5  GAIT: Distance walked: 50  Assistive device utilized: None Level of assistance: Complete Independence  TREATMENT DATE:  09/09/2024 NuStep Level 4 5 mins PT present to discuss status Hooklying PPT x 10 TA activation + march x 10 bilateral  90/90 toe tap 2 x 10 bilateral (5 sec side) TA activation + bridge with 15# KB 2 x 10 TA activation + clamshell 2 x 10 bilateral with red loop  Standing on airex + around the worlds 15# KB x 10 each direction 15# KB shoulder height + march x 12 total then switching hands Pallof Press at cable column 10# x 12 each direction  Standing trunk rotation at cable column 10# x 8 each direction Unilateral farmer's carry 15# KB x 2 laps around ortho gym (switching hands) Hamstring stretch at stair 2 x 30 sec bilateral  Seated figure four stretch 2 x 30 sec bilateral       09/02/2024 Trigger Point Dry Needling  Subsequent Treatment: Instructions provided previously at initial dry needling treatment.   Patient Verbal Consent Given: Yes Education Handout Provided: Previously Provided Muscles Treated: Rt QL & Lt lumbar multifidi Lt  Electrical Stimulation Performed: No Treatment Response/Outcome: Utilized skilled palpation to identify trigger points.  During dry needling able to palpate muscle twitch and muscle elongation  Soft tissue mobilization performed to further promote  tissue elongation and decreased pain.      Single knee to chest 2 x 30 sec Rt  Supine glute stretch 2 x 30 sec Rt  Hip internal/ external rotation x 8 5 sec hold Supine ITB stretch 2 x 30 sec bilateral  Child's Pose 2 x 30 sec  07/27/24: NuStep Level 3 (legs only) 5 mins- PT present to discuss status Quadruped UE lift, LE lift, alternating UE/LE 5x each  Supine transverse abdominal draw in 10x  Supine bent knee sequential lift/lower 10x  Hooklying alt hand and knee press with purple ball x 10 bilateral   Trigger Point Dry Needling  Subsequent Treatment: Instructions provided previously at initial dry needling treatment.   Patient Verbal Consent Given: Yes Education Handout Provided: Previously Provided Muscles Treated: Right QL side lying; right lumbar multifidi  Electrical Stimulation Performed: No Treatment Response/Outcome:  Patient will verbalize and demonstrate correct body mechanics for transfers and lifting to optimize mobility of third trimester of current pregnancy  Open books x 10 bilateral  Doorway QL stretch 2 x 15 sec bilateral      07/08/24: Supine transverse abdominal draw in 10x (Added to HEP- see below) Supine draw in with hand to knee isometric push 10x (Added to HEP- see below) Supine bent knee sequential lift/lower 10x (Added to HEP- see below) Quadruped UE lift, LE lift, alternating UE/LE 5x each (Added to HEP- see below) Standing double green band hip abduction 5x right/left; hip extension 5x in hip hinge position (Added to HEP- see below)    PATIENT EDUCATION:  Education details: Access Code: [email or web address] Person educated: Patient Education method: Explanation, Demonstration, and Handouts Education comprehension: verbalized understanding and returned demonstration  HOME EXERCISE PROGRAM: Access Code: [email or web address] URL: [email or web address] Date: 09/02/2024 Prepared by: Kristeen Sar  Exercises - Child's Pose Stretch  - 2 x daily - 7 x  weekly - 1 sets - 3 reps - 20 hold - Child's Pose with Sidebending  - 2 x daily - 7 x weekly - 1 sets - 3 reps - 20 hold - Seated Hamstring Stretch  - 3 x daily - 7 x weekly - 1 sets - 3 reps - 20 hold - Seated Piriformis Stretch with Trunk Bend  - 3 x daily - 7 x weekly - 1 sets - 3 reps - 20 hold - Standing 'L' Stretch at Counter  - 3 x daily - 7 x weekly - 1 sets - 3 reps - 20 hold - Sidelying Open Book Thoracic Rotation with Knee on Foam Roll  - 1 x daily - 7 x weekly - 1 sets - 10 reps - Standing Quadratus Lumborum Stretch with Doorway (Mirrored)  - 1 x daily - 7 x weekly - 1 sets - 5 reps - 20 hold - Supine Transversus Abdominis Bracing - Hands on Stomach  - 1 x daily - 7 x weekly - 1 sets - 10 reps - Hooklying Isometric Hip Flexion  - 1 x daily - 7 x weekly - 1 sets - 10 reps - Hooklying Sequential Leg March and Lower  - 1 x daily - 7 x weekly - 1 sets - 10 reps - Bird Dog  - 1 x daily - 7 x weekly - 1 sets - 10 reps - Standing Clam with Resistance Loop  - 1 x daily - 7 x weekly - 1 sets - 10 reps - Farmer's Carry with Kettlebells  - 1 x daily - 7 x weekly - 1 sets - 10 reps - Supine Single Knee to Chest Stretch  - 1 x daily - 7 x weekly - 2 sets - 30s hold - Supine Gluteus Stretch  - 1 x daily - 7 x weekly - 2 sets - 30s hold - Supine Hip Internal and External Rotation  - 1 x daily -  7 x weekly - 8 reps - 4-5s hold - Supine ITB Stretch with Strap  - 1 x daily - 7 x weekly - 2 sets - 30s hold   ASSESSMENT:  CLINICAL IMPRESSION: Patient presents with no low back pain today. She responded well to dry needling last session. Discussed POC and she has a big trip planned in December and she would like to feel stronger for that. Plan to do a re certification next session to extend POC. Treatment session focused on core strengthening. Patient tolerated all exercises well and did not verbalize any increased pain or discomfort. PT monitored patient response throughout and provided verbal and  visual cues as needed. Patient demonstrates good rehab potential to achieve stated goals through skilled therapy intervention.        OBJECTIVE IMPAIRMENTS: decreased mobility, decreased ROM, hypomobility, improper body mechanics, postural dysfunction, and pain.   ACTIVITY LIMITATIONS: sitting and standing  PARTICIPATION LIMITATIONS: meal prep, cleaning, laundry, and occupation  PERSONAL FACTORS: 1-2 comorbidities: endometriosis with surgery, chronic pelvic pain are also affecting patient's functional outcome.   REHAB POTENTIAL: Good  CLINICAL DECISION MAKING: Stable/uncomplicated  EVALUATION COMPLEXITY: Low   GOALS: Goals reviewed with patient? Yes  SHORT TERM GOALS: Target date: 06/28/2024    Be independent in initial HEP Baseline: Goal status: met 9/18  2.  Report > or = to 30% reduction in LBP with prolonged sitting or standing  Baseline:  Goal status: MET 07/27/2024  3.  Verbalize and demonstrate body mechanics modifications modifications for lumbar protection. Baseline:  Goal status: met 9/18    LONG TERM GOALS: Target date: 09/24/2024   Be independent in advanced HEP Baseline:  Goal status: IN PROGRESS 07/27/2024  2.  Improve Modified Oswestry to < or = to 10% disability  Baseline: 10/50=20% Goal status: IN PROGRESS 07/27/2024  3.  Report > or = to 70% reduction in LBP with prolonged sitting and standing  Baseline:  Goal status: IN PROGRESS (50%) 07/27/2024  4.  Sit and stand with Rt=Lt weightbearing to improve muscular balance  Baseline:  Goal status: MET 07/27/2024  5.  Initiate gym program and verbalize understanding of safe progression Baseline:  Goal status: IN PROGRESS 07/27/2024    PLAN:  PT FREQUENCY: 2x/week  PT DURATION: 8 weeks  PLANNED INTERVENTIONS: 97110-Therapeutic exercises, 97530- Therapeutic activity, 97112- Neuromuscular re-education, 97535- Self Care, [id number]- Manual therapy, [phone]- Gait training, [phone]- Canalith repositioning,  [id number]- Aquatic Therapy, [phone]- Electrical stimulation (unattended), [phone]- Electrical stimulation (manual), [id number]- Ultrasound, [id number]- Traction (mechanical), [id number] (1-2 muscles), [id number] (3+ muscles)- Dry Needling, Patient/Family education, Balance training, Taping, Joint mobilization, Joint manipulation, Spinal manipulation, Spinal mobilization, Vestibular training, Cryotherapy, and Moist heat.  PLAN FOR NEXT SESSION: recert next session (new date 11/05/2024) compliance with core exercises; see how DN was; progression of core strength; work on symmetry  Kristeen Sar, PT, DPT 09/09/24 5:06 PM

## 2024-09-21 ENCOUNTER — Encounter: Admitting: Physical Therapy

## 2024-09-30 ENCOUNTER — Encounter: Payer: Self-pay | Admitting: Physical Therapy

## 2024-09-30 ENCOUNTER — Ambulatory Visit: Payer: Self-pay | Attending: Family | Admitting: Physical Therapy

## 2024-09-30 DIAGNOSIS — M5459 Other low back pain: Secondary | ICD-10-CM

## 2024-09-30 DIAGNOSIS — R252 Cramp and spasm: Secondary | ICD-10-CM | POA: Insufficient documentation

## 2024-09-30 NOTE — Therapy (Addendum)
 OUTPATIENT PHYSICAL THERAPY THORACOLUMBAR PROGRESS NOTE/ RE-CERTIFICATION   Patient Name: April Holland MRN: [id number] DOB:Apr 11, 1986, 38 y.o., female Today's Date: 09/30/2024  END OF SESSION:  PT End of Session - 09/30/24 1703     Visit Number 7    Date for Recertification  11/05/24    Authorization Type [employer]  DN ABN signed 11/13    PT Start Time 1626   arrival time   PT Stop Time 1701    PT Time Calculation (min) 35 min    Activity Tolerance Patient tolerated treatment well    Behavior During Therapy [facility] for tasks assessed/performed              Past Medical History:  Diagnosis Date   Endometriosis    Essential hypertension 09/17/2021   Family history of other endocrine, nutritional and metabolic diseases 02/27/2022   Hypertension    Prediabetes    Rupture of ovarian cyst    Past Surgical History:  Procedure Laterality Date   DILATATION & CURETTAGE/HYSTEROSCOPY WITH MYOSURE N/A 08/09/2020   Procedure: DILATATION & CURETTAGE/HYSTEROSCOPY WITH MYOSURE;  Surgeon: Rosalva Sawyer, MD;  Location: [facility];  Service: Gynecology;  Laterality: N/A;   HYSTEROSCOPY  2022   WISDOM TOOTH EXTRACTION     Patient Active Problem List   Diagnosis Date Noted   Pelvic pain 03/11/2024   Screening for HIV (human immunodeficiency virus) 10/13/2022   Medication monitoring encounter 10/10/2022   Pelvic abscess in female 10/10/2022   Patellofemoral pain syndrome 09/06/2022   Abdominal pain with fever after surgery 07/16/2022   Tubo-ovarian abscess 07/15/2022   Positive ANA (antinuclear antibody) 02/28/2022   Menstrual disorder 02/27/2022   Family history of other endocrine, nutritional and metabolic diseases 02/27/2022   Menorrhagia 02/27/2022   Female infertility 02/27/2022   Prediabetes 02/27/2022   Vitamin D deficiency 02/27/2022   Essential hypertension 09/17/2021   Goiter 01/11/2013    PCP: Cleotilde Ip , PA  REFERRING PROVIDER: Jolie Krabbe,  FNP  REFERRING DIAG:  Diagnosis  M62.830 (ICD-10-CM) - Muscle spasm of back  R10.31 (ICD-10-CM) - Right lower quadrant pain    Rationale for Evaluation and Treatment: Rehabilitation  THERAPY DIAG:  Cramp and spasm  Other low back pain  ONSET DATE: 2 year history   SUBJECTIVE:                                                                                                                                                                                           SUBJECTIVE STATEMENT: Patient reports she is doing good today. She is not currently having any pain.   EVAL; I've had an interesting  2 years.  I've been to ED with abdominal pain, had ovarian cyst rupture and have end stage endometriosis. Had laproscopy and now pelvic pain is much better. I've more recently started experiencing Rt lumbar tension over the past several months.  Pt goes to the chiropractor 1x/month for adjustments and assisted stretching.   PERTINENT HISTORY:  HTN, endometriosis  PAIN: 09/09/24 Are you having pain? Yes: NPRS scale: 0/10 Pain location: Rt lumbar/flank Pain description: pulsating, achy Aggravating factors: long periods of sitting/standing  Relieving factors: change of position, stretching, Ibuprofen /Tylenol , TENs, heat  PRECAUTIONS: None  RED FLAGS: None   WEIGHT BEARING RESTRICTIONS: No  FALLS:  Has patient fallen in last 6 months? Yes. Number of falls tripped on dog- no balance deficits   LIVING ENVIRONMENT: Lives with: lives with their family   OCCUPATION: desk work  PLOF: Independent, Vocation/Vocational requirements: desk work, and Leisure: gym-weights, dance, cooking, gardening   PATIENT GOALS: return to the gym, reduce pain in low back with daily activity  NEXT MD VISIT: MRI has been ordered-for follow-up  OBJECTIVE:  Note: Objective measures were completed at Evaluation unless otherwise noted.  DIAGNOSTIC FINDINGS:  None for low back   PATIENT SURVEYS:   8/11/25Modified Oswestry: 20% (10/50)  09/30/2024 MODI: 3/50 6%  COGNITION: Overall cognitive status: Within functional limits for tasks assessed     SENSATION: WFL  MUSCLE LENGTH: Rt hamstring flexibility limited by 25% vs the Lt.  All other hip flexibility is WFLs  POSTURE: rounded shoulders and weight shift left  PALPATION: Palpable tension Rt>Lt lumbar parapsinals and QL. Reduced PA mobility in the lumbar spine without pain.   LUMBAR ROM:  Full lumbar A/ROM with pain with Rt sidebending and flexion.     LOWER EXTREMITY MMT:    Rt hip 4+/5, Rt lumbar pain with hip flexion, Lt hip 5/5  GAIT: Distance walked: 50  Assistive device utilized: None Level of assistance: Complete Independence  TREATMENT DATE:  09/30/2024 ODI,Goal assessment, update on status 90/90 toe tap with 6# DB over head 2 x 10 TA activation + bridge with 15# KB 2 x 10 Hooklying march with shoulder extension (PT anchoring) green TB 2 x 10 Side plank + rotation x 10 each  15# KB shoulder height + march x 12 total then switching hands Squat tap with 15# KB 2 x 10 Forward T x 10 each side Supine ITB stretch with green strap  x 30 sec Rt  Supine hamstring stretch  x 30 sec bilateral with green strap 3 way stability ball stretch x 8 each direction    09/09/2024 NuStep Level 4 5 mins PT present to discuss status Hooklying PPT x 10 TA activation + march x 10 bilateral  90/90 toe tap 2 x 10 bilateral (5 sec side) TA activation + bridge with 15# KB 2 x 10 TA activation + clamshell 2 x 10 bilateral with red loop  Standing on airex + around the worlds 15# KB x 10 each direction 15# KB shoulder height + march x 12 total then switching hands Pallof Press at cable column 10# x 12 each direction  Standing trunk rotation at cable column 10# x 8 each direction Unilateral farmer's carry 15# KB x 2 laps around ortho gym (switching hands) Hamstring stretch at stair 2 x 30 sec bilateral  Seated figure four  stretch 2 x 30 sec bilateral       09/02/2024 Trigger Point Dry Needling  Subsequent Treatment: Instructions provided previously at initial dry needling treatment.  Patient Verbal Consent Given: Yes Education Handout Provided: Previously Provided Muscles Treated: Rt QL & Lt lumbar multifidi Lt  Electrical Stimulation Performed: No Treatment Response/Outcome: Utilized skilled palpation to identify trigger points.  During dry needling able to palpate muscle twitch and muscle elongation  Soft tissue mobilization performed to further promote tissue elongation and decreased pain.      Single knee to chest 2 x 30 sec Rt  Supine glute stretch 2 x 30 sec Rt  Hip internal/ external rotation x 8 5 sec hold Supine ITB stretch 2 x 30 sec bilateral  Child's Pose 2 x 30 sec     07/27/24: NuStep Level 3 (legs only) 5 mins- PT present to discuss status Quadruped UE lift, LE lift, alternating UE/LE 5x each  Supine transverse abdominal draw in 10x  Supine bent knee sequential lift/lower 10x  Hooklying alt hand and knee press with purple ball x 10 bilateral   Trigger Point Dry Needling  Subsequent Treatment: Instructions provided previously at initial dry needling treatment.   Patient Verbal Consent Given: Yes Education Handout Provided: Previously Provided Muscles Treated: Right QL side lying; right lumbar multifidi  Electrical Stimulation Performed: No Treatment Response/Outcome:  Patient will verbalize and demonstrate correct body mechanics for transfers and lifting to optimize mobility of third trimester of current pregnancy  Open books x 10 bilateral  Doorway QL stretch 2 x 15 sec bilateral    PATIENT EDUCATION:  Education details: Access Code: [email or web address] Person educated: Patient Education method: Explanation, Demonstration, and Handouts Education comprehension: verbalized understanding and returned demonstration  HOME EXERCISE PROGRAM: Access Code: [email or web address] URL:  [email or web address] Date: 09/02/2024 Prepared by: Kristeen Sar  Exercises - Child's Pose Stretch  - 2 x daily - 7 x weekly - 1 sets - 3 reps - 20 hold - Child's Pose with Sidebending  - 2 x daily - 7 x weekly - 1 sets - 3 reps - 20 hold - Seated Hamstring Stretch  - 3 x daily - 7 x weekly - 1 sets - 3 reps - 20 hold - Seated Piriformis Stretch with Trunk Bend  - 3 x daily - 7 x weekly - 1 sets - 3 reps - 20 hold - Standing 'L' Stretch at Counter  - 3 x daily - 7 x weekly - 1 sets - 3 reps - 20 hold - Sidelying Open Book Thoracic Rotation with Knee on Foam Roll  - 1 x daily - 7 x weekly - 1 sets - 10 reps - Standing Quadratus Lumborum Stretch with Doorway (Mirrored)  - 1 x daily - 7 x weekly - 1 sets - 5 reps - 20 hold - Supine Transversus Abdominis Bracing - Hands on Stomach  - 1 x daily - 7 x weekly - 1 sets - 10 reps - Hooklying Isometric Hip Flexion  - 1 x daily - 7 x weekly - 1 sets - 10 reps - Hooklying Sequential Leg March and Lower  - 1 x daily - 7 x weekly - 1 sets - 10 reps - Bird Dog  - 1 x daily - 7 x weekly - 1 sets - 10 reps - Standing Clam with Resistance Loop  - 1 x daily - 7 x weekly - 1 sets - 10 reps - Farmer's Carry with Kettlebells  - 1 x daily - 7 x weekly - 1 sets - 10 reps - Supine Single Knee to Chest Stretch  - 1 x daily - 7 x weekly - 2 sets - 30s  hold - Supine Gluteus Stretch  - 1 x daily - 7 x weekly - 2 sets - 30s hold - Supine Hip Internal and External Rotation  - 1 x daily - 7 x weekly - 8 reps - 4-5s hold - Supine ITB Stretch with Strap  - 1 x daily - 7 x weekly - 2 sets - 30s hold   ASSESSMENT:  CLINICAL IMPRESSION: Re-certification completed today. Grettell verbalized feeling 80-90% better since starting skilled therapy. Her standing and sitting tolerance has improved and she has been more aware of moving more throughout her day. HEP compliance can be improved, but she does have relief when she is consistent with her core exercises. She is  still challenged with some twisting motions, dancing, and sleep positions. Educated patient on healthy sleeping positions she can try to prevent excessive twisting. She has a big trip planned for Christmas and would benefit from continued therapy to meet remaining goals, finalize HEP, and progress core exercises.  Patient demonstrates good rehab potential to achieve stated goals through skilled therapy intervention.         OBJECTIVE IMPAIRMENTS: decreased mobility, decreased ROM, hypomobility, improper body mechanics, postural dysfunction, and pain.   ACTIVITY LIMITATIONS: sitting and standing  PARTICIPATION LIMITATIONS: meal prep, cleaning, laundry, and occupation  PERSONAL FACTORS: 1-2 comorbidities: endometriosis with surgery, chronic pelvic pain are also affecting patient's functional outcome.   REHAB POTENTIAL: Good  CLINICAL DECISION MAKING: Stable/uncomplicated  EVALUATION COMPLEXITY: Low   GOALS: Goals reviewed with patient? Yes  SHORT TERM GOALS: Target date: 06/28/2024    Be independent in initial HEP Baseline: Goal status: met 9/18  2.  Report > or = to 30% reduction in LBP with prolonged sitting or standing  Baseline:  Goal status: MET 07/27/2024  3.  Verbalize and demonstrate body mechanics modifications modifications for lumbar protection. Baseline:  Goal status: met 9/18    LONG TERM GOALS: Target date: 11/05/2024   Be independent in advanced HEP Baseline:  Goal status: IN PROGRESS   2.  Improve Modified Oswestry to < or = to 10% disability  Baseline: 10/50=20% Goal status: MET 09/30/2024  3.  Report > or = to 70% reduction in LBP with prolonged sitting and standing  Baseline:  Goal status: MET (80/90) 12/11  4.  Sit and stand with Rt=Lt weightbearing to improve muscular balance  Baseline:  Goal status: MET 07/27/2024  5.  Initiate gym program and verbalize understanding of safe progression Baseline:  Goal status: IN PROGRESS 12/11  6.   Patient will verbalize and demonstrate self-care strategies to manage pain including tissue mobility practices and change of position. Baseline:  Goal status: NEW     PLAN:  PT FREQUENCY: 1-2x/week  PT DURATION: other: 5 weeks  PLANNED INTERVENTIONS: 97110-Therapeutic exercises, 97530- Therapeutic activity, [id number]- Neuromuscular re-education, 97535- Self Care, [id number]- Manual therapy, [phone]- Gait training, [phone]- Canalith repositioning, [id number]- Aquatic Therapy, [phone]- Electrical stimulation (unattended), [phone]- Electrical stimulation (manual), [id number]- Ultrasound, [id number]- Traction (mechanical), [id number] (1-2 muscles), [id number] (3+ muscles)- Dry Needling, Patient/Family education, Balance training, Taping, Joint mobilization, Joint manipulation, Spinal manipulation, Spinal mobilization, Vestibular training, Cryotherapy, and Moist heat.  PLAN FOR NEXT SESSION: compliance with core exercises;  progression of core strength; work on symmetry  Kristeen Sar, PT, DPT 09/30/2024 5:04 PM

## 2024-09-30 NOTE — Addendum Note (Signed)
 Addended by: JANIT KRISTEEN RAMAN on: 09/30/2024 05:08 PM   Modules accepted: Orders

## 2024-10-06 ENCOUNTER — Ambulatory Visit: Payer: Self-pay

## 2024-10-27 ENCOUNTER — Ambulatory Visit: Payer: Self-pay | Admitting: Physical Therapy
